# Patient Record
Sex: Female | Born: 1985 | Race: White | Hispanic: No | Marital: Married | Smoking: Current every day smoker
Health system: Southern US, Community
[De-identification: ages and names within clinical notes are randomized; demographics above are authoritative.]

## PROBLEM LIST (undated history)

## (undated) DIAGNOSIS — K922 Gastrointestinal hemorrhage, unspecified: Secondary | ICD-10-CM

## (undated) DIAGNOSIS — F1010 Alcohol abuse, uncomplicated: Secondary | ICD-10-CM

## (undated) DIAGNOSIS — F32A Depression, unspecified: Secondary | ICD-10-CM

## (undated) DIAGNOSIS — K219 Gastro-esophageal reflux disease without esophagitis: Secondary | ICD-10-CM

## (undated) DIAGNOSIS — F419 Anxiety disorder, unspecified: Secondary | ICD-10-CM

## (undated) DIAGNOSIS — D649 Anemia, unspecified: Secondary | ICD-10-CM

## (undated) DIAGNOSIS — F1910 Other psychoactive substance abuse, uncomplicated: Secondary | ICD-10-CM

## (undated) HISTORY — DX: Other psychoactive substance abuse, uncomplicated: F19.10

## (undated) HISTORY — DX: Anemia, unspecified: D64.9

---

## 2010-01-30 ENCOUNTER — Ambulatory Visit: Payer: Self-pay

## 2019-08-14 ENCOUNTER — Emergency Department
Admission: EM | Admit: 2019-08-14 | Discharge: 2019-08-14 | Disposition: A | Discharge status: Home / Self Care | Payer: BLUE CROSS/BLUE SHIELD | Source: Home / Self Care | Attending: Student | Admitting: Student

## 2019-08-14 ENCOUNTER — Emergency Department: Payer: BLUE CROSS/BLUE SHIELD

## 2019-08-14 ENCOUNTER — Other Ambulatory Visit: Payer: Self-pay

## 2019-08-14 DIAGNOSIS — K920 Hematemesis: Secondary | ICD-10-CM | POA: Insufficient documentation

## 2019-08-14 DIAGNOSIS — K921 Melena: Secondary | ICD-10-CM | POA: Diagnosis not present

## 2019-08-14 DIAGNOSIS — K226 Gastro-esophageal laceration-hemorrhage syndrome: Secondary | ICD-10-CM | POA: Diagnosis not present

## 2019-08-14 LAB — COMPREHENSIVE METABOLIC PANEL
ALT: 17 U/L (ref 0–44)
AST: 21 U/L (ref 15–41)
Albumin: 4.7 g/dL (ref 3.5–5.0)
Alkaline Phosphatase: 54 U/L (ref 38–126)
Anion gap: 12 (ref 5–15)
BUN: 26 mg/dL — ABNORMAL HIGH (ref 6–20)
CO2: 21 mmol/L — ABNORMAL LOW (ref 22–32)
Calcium: 9.2 mg/dL (ref 8.9–10.3)
Chloride: 107 mmol/L (ref 98–111)
Creatinine, Ser: 0.61 mg/dL (ref 0.44–1.00)
GFR calc Af Amer: >60 mL/min (ref >60)
GFR calc non Af Amer: >60 mL/min (ref >60)
Glucose, Bld: 102 mg/dL — ABNORMAL HIGH (ref 70–99)
Potassium: 4.7 mmol/L (ref 3.5–5.1)
Sodium: 140 mmol/L (ref 135–145)
Total Bilirubin: 0.7 mg/dL (ref 0.3–1.2)
Total Protein: 8.1 g/dL (ref 6.5–8.1)

## 2019-08-14 LAB — CBC
HCT: 38.7 % (ref 36.0–46.0)
Hemoglobin: 12.5 g/dL (ref 12.0–15.0)
MCH: 30.3 pg (ref 26.0–34.0)
MCHC: 32.3 g/dL (ref 30.0–36.0)
MCV: 93.7 fL (ref 80.0–100.0)
Platelets: 304 10*3/uL (ref 150–400)
RBC: 4.13 MIL/uL (ref 3.87–5.11)
RDW: 13.4 % (ref 11.5–15.5)
WBC: 10.9 10*3/uL — ABNORMAL HIGH (ref 4.0–10.5)
nRBC: 0 % (ref 0.0–0.2)

## 2019-08-14 LAB — URINALYSIS, COMPLETE (UACMP) WITH MICROSCOPIC
Bacteria, UA: NONE SEEN
Bilirubin Urine: NEGATIVE
Glucose, UA: NEGATIVE mg/dL
Hgb urine dipstick: NEGATIVE
Ketones, ur: 80 mg/dL — AB
Leukocytes,Ua: NEGATIVE
Nitrite: NEGATIVE
Protein, ur: NEGATIVE mg/dL
Specific Gravity, Urine: 1.023 (ref 1.005–1.030)
pH: 7 (ref 5.0–8.0)

## 2019-08-14 LAB — PROTIME-INR
INR: 1 (ref 0.8–1.2)
Prothrombin Time: 12.8 seconds (ref 11.4–15.2)

## 2019-08-14 LAB — LIPASE, BLOOD: Lipase: 27 U/L (ref 11–51)

## 2019-08-14 LAB — PREGNANCY, URINE: Preg Test, Ur: NEGATIVE

## 2019-08-14 LAB — POCT PREGNANCY, URINE: Preg Test, Ur: NEGATIVE

## 2019-08-14 LAB — HEMOGLOBIN AND HEMATOCRIT, BLOOD
HCT: 35.3 % — ABNORMAL LOW (ref 36.0–46.0)
Hemoglobin: 11.4 g/dL — ABNORMAL LOW (ref 12.0–15.0)

## 2019-08-14 MED ORDER — ONDANSETRON HCL 4 MG/2ML IJ SOLN
4.0000 mg | Freq: Once | INTRAMUSCULAR | Status: AC
  Administered 2019-08-14: 09:00:00 4 mg via INTRAVENOUS
  Filled 2019-08-14: qty 2

## 2019-08-14 MED ORDER — SODIUM CHLORIDE 0.9% FLUSH
3.0000 mL | Freq: Once | INTRAVENOUS | Status: AC
  Administered 2019-08-14: 09:00:00 3 mL via INTRAVENOUS

## 2019-08-14 MED ORDER — PANTOPRAZOLE SODIUM 40 MG IV SOLR
40.0000 mg | Freq: Once | INTRAVENOUS | Status: AC
  Administered 2019-08-14: 40 mg via INTRAVENOUS
  Filled 2019-08-14: qty 40

## 2019-08-14 MED ORDER — SODIUM CHLORIDE 0.9 % IV BOLUS
1000.0000 mL | Freq: Once | INTRAVENOUS | Status: AC
  Administered 2019-08-14: 1000 mL via INTRAVENOUS

## 2019-08-14 MED ORDER — OMEPRAZOLE 40 MG PO CPDR: 40.0000 mg | DELAYED_RELEASE_CAPSULE | Freq: Every day | ORAL | 0 refills | Status: DC

## 2019-08-14 MED ORDER — ONDANSETRON HCL 4 MG PO TABS: 4.0000 mg | ORAL_TABLET | Freq: Three times a day (TID) | ORAL | 0 refills | Status: DC | PRN

## 2019-08-14 NOTE — ED Provider Notes (Signed)
[facility] Emergency Department Provider Note  ____________________________________________   First MD Initiated Contact with Patient 08/14/19 [phone]     (approximate)  I have reviewed the triage vital signs and the nursing notes.  History  Chief Complaint Hematemesis    HPI Gail Mullins is a 33 y.o. female who presents emergency department for nausea, vomiting, hematemesis.  Patient states she had an initial episode of emesis around 5 AM.  This was nonbloody and looked like the wine she drank the night before.  After this initially vomiting however, she developed several episodes of emesis with bright red blood.  No clots.  She denies any history of prior hematemesis.  She is not any blood thinning medications and denies any heavy NSAID use.  She states last night she drank about a liter of wine.  About 4 to 5 years ago she did have a period of heavy drinking, drinking almost daily for about 8 months.  However, since then she has decreased her drinking, and typically only drinks about twice per week.  She denies any known diagnosis of cirrhosis or varices. She denies any pain.   Past Medical Hx No past medical history on file.  Problem List There are no active problems to display for this patient.   Past Surgical Hx Non-contributory  Medications Prior to Admission medications   Not on File    Allergies Patient has no known allergies.  Family Hx No family history on file.  Social Hx Social History   Tobacco Use  . Smoking status: Not on file  Substance Use Topics  . Alcohol use: Not on file  . Drug use: Not on file     Review of Systems  Constitutional: Negative for fever, chills. Eyes: Negative for visual changes. ENT: Negative for sore throat. Cardiovascular: Negative for chest pain. Respiratory: Negative for shortness of breath. Gastrointestinal: + vomiting Genitourinary: Negative for dysuria. Musculoskeletal: Negative for  leg swelling. Skin: Negative for rash. Neurological: Negative for for headaches.   Physical Exam  Vital Signs: ED Triage Vitals  Enc Vitals Group     BP 08/14/19 0802 (!) 108/51     Pulse Rate 08/14/19 0802 (!) 105     Resp 08/14/19 0802 18     Temp 08/14/19 0802 98.7 F (37.1 C)     Temp Source 08/14/19 0802 Oral     SpO2 08/14/19 0802 100 %     Weight 08/14/19 0812 150 lb (68 kg)     Height 08/14/19 0812 5\' 7"  (1.702 m)     Head Circumference --      Peak Flow --      Pain Score 08/14/19 0812 0     Pain Loc --      Pain Edu? --      Excl. in GC? --     Constitutional: Alert and oriented.  Head: Normocephalic. Atraumatic. Eyes: Conjunctivae clear. Sclera anicteric. Nose: No congestion. No rhinorrhea. Mouth/Throat: Mucous membranes are moist.  Neck: No stridor.   Cardiovascular: Normal rate, regular rhythm. Extremities well perfused. Respiratory: Normal respiratory effort.  Lungs CTAB. Gastrointestinal: Soft. Non-tender. Non-distended. Emesis in bag is guaiac positive.  Musculoskeletal: No lower extremity edema. No deformities. Neurologic:  Normal speech and language. No gross focal neurologic deficits are appreciated.  Skin: Skin is warm, dry and intact. No rash noted. Psychiatric: Mood and affect are appropriate for situation.  EKG  N/A    Radiology  XR: IMPRESSION:  Negative for acute cardiopulmonary disease  Procedures  Procedure(s) performed (including critical care):  Procedures   Initial Impression / Assessment and Plan / ED Course  33 y.o. female who presents to the ED for bloody vomiting.   Suspect likely Mallory Weiss based on history and progression of emesis from initially non-bloody to bloody. She does have a past history of heavy drinking for ~8 months, several years ago. However, this timeline does not seem consistent with development of cirrhosis/varices.   Will check labs, give fluids, PPI, and reassess.   Labs w/o actionable  derangements. No significant drop in hemoglobin after period of observation (12.5 to 11.4 after 1 L IVF would be appropriate). She has had no further episodes of emesis. Will d/c with Rx for PPI and Zofran and advised close GI follow up. Advised cessation of alcohol consumption until GI evaluation. Discussed strict return precautions. Patient is agreeable with plan.    Final Clinical Impression(s) / ED Diagnosis  Final diagnoses:  Hematemesis with nausea       Note:  This document was prepared using Dragon voice recognition software and may include unintentional dictation errors.   Lilia Pro., MD 08/14/19 Curly Rim

## 2019-08-14 NOTE — ED Triage Notes (Signed)
Pt arrives via pov from home, pt reports she woke up this am at 0500 and vomited, noticed vomit was dark, but states didn't think much due to drinking wine last pm, states that she shortly after vomited again and noticed a large amount of blood in vomit, pt vomiting in triage currently and frank blood noted, no food fragments, pt also reports diarrhea that was dark, pt reports 5 episodes of vomiting blood at this time

## 2019-08-14 NOTE — ED Notes (Signed)

## 2019-08-14 NOTE — Discharge Instructions (Addendum)
Thank you for letting us take care of you in the emergency department today.   Please continue to take any regular, prescribed medications.  Please avoid drinking any alcohol until you are seen by the GI doctor, as this can exacerbate the irritation of your esophagus and stomach.  New medications we have prescribed:  - Omeprazole - acid protecting medication for your esophagus and stomach - Zofran - antinausea medication  Please follow up with: - GI doctor  Please return to the ER for any new or worsening symptoms.

## 2019-08-15 ENCOUNTER — Inpatient Hospital Stay
Admission: EM | Admit: 2019-08-15 | Discharge: 2019-08-17 | DRG: 357 | Disposition: A | Discharge status: Home / Self Care | Payer: BLUE CROSS/BLUE SHIELD | Attending: Internal Medicine | Admitting: Internal Medicine

## 2019-08-15 ENCOUNTER — Inpatient Hospital Stay: Payer: BLUE CROSS/BLUE SHIELD | Admitting: Anesthesiology

## 2019-08-15 ENCOUNTER — Other Ambulatory Visit: Payer: Self-pay

## 2019-08-15 ENCOUNTER — Encounter: Payer: Self-pay | Admitting: *Deleted

## 2019-08-15 ENCOUNTER — Emergency Department: Payer: BLUE CROSS/BLUE SHIELD

## 2019-08-15 ENCOUNTER — Encounter
Admission: EM | Disposition: A | Discharge status: Home / Self Care | Payer: Self-pay | Source: Home / Self Care | Attending: Pulmonary Disease

## 2019-08-15 DIAGNOSIS — K920 Hematemesis: Secondary | ICD-10-CM

## 2019-08-15 DIAGNOSIS — D62 Acute posthemorrhagic anemia: Secondary | ICD-10-CM

## 2019-08-15 DIAGNOSIS — F1020 Alcohol dependence, uncomplicated: Secondary | ICD-10-CM | POA: Diagnosis present

## 2019-08-15 DIAGNOSIS — F17200 Nicotine dependence, unspecified, uncomplicated: Secondary | ICD-10-CM | POA: Diagnosis present

## 2019-08-15 DIAGNOSIS — Z20828 Contact with and (suspected) exposure to other viral communicable diseases: Secondary | ICD-10-CM | POA: Diagnosis present

## 2019-08-15 DIAGNOSIS — Z79899 Other long term (current) drug therapy: Secondary | ICD-10-CM

## 2019-08-15 DIAGNOSIS — F1011 Alcohol abuse, in remission: Secondary | ICD-10-CM | POA: Diagnosis present

## 2019-08-15 DIAGNOSIS — F17210 Nicotine dependence, cigarettes, uncomplicated: Secondary | ICD-10-CM | POA: Diagnosis present

## 2019-08-15 DIAGNOSIS — K922 Gastrointestinal hemorrhage, unspecified: Secondary | ICD-10-CM | POA: Diagnosis not present

## 2019-08-15 DIAGNOSIS — K2970 Gastritis, unspecified, without bleeding: Secondary | ICD-10-CM | POA: Diagnosis present

## 2019-08-15 DIAGNOSIS — K219 Gastro-esophageal reflux disease without esophagitis: Secondary | ICD-10-CM | POA: Diagnosis not present

## 2019-08-15 DIAGNOSIS — R000 Tachycardia, unspecified: Secondary | ICD-10-CM | POA: Diagnosis present

## 2019-08-15 DIAGNOSIS — K921 Melena: Secondary | ICD-10-CM | POA: Diagnosis present

## 2019-08-15 DIAGNOSIS — F1010 Alcohol abuse, uncomplicated: Secondary | ICD-10-CM | POA: Diagnosis not present

## 2019-08-15 DIAGNOSIS — K226 Gastro-esophageal laceration-hemorrhage syndrome: Secondary | ICD-10-CM | POA: Diagnosis present

## 2019-08-15 DIAGNOSIS — I959 Hypotension, unspecified: Secondary | ICD-10-CM | POA: Diagnosis present

## 2019-08-15 DIAGNOSIS — F419 Anxiety disorder, unspecified: Secondary | ICD-10-CM | POA: Diagnosis present

## 2019-08-15 DIAGNOSIS — Z720 Tobacco use: Secondary | ICD-10-CM | POA: Diagnosis not present

## 2019-08-15 HISTORY — PX: ESOPHAGOGASTRODUODENOSCOPY: SHX5428

## 2019-08-15 HISTORY — DX: Alcohol abuse, uncomplicated: F10.10

## 2019-08-15 LAB — COMPREHENSIVE METABOLIC PANEL
ALT: 10 U/L (ref 0–44)
AST: 18 U/L (ref 15–41)
Albumin: 3.1 g/dL — ABNORMAL LOW (ref 3.5–5.0)
Alkaline Phosphatase: 31 U/L — ABNORMAL LOW (ref 38–126)
Anion gap: 7 (ref 5–15)
BUN: 50 mg/dL — ABNORMAL HIGH (ref 6–20)
CO2: 19 mmol/L — ABNORMAL LOW (ref 22–32)
Calcium: 8.1 mg/dL — ABNORMAL LOW (ref 8.9–10.3)
Chloride: 110 mmol/L (ref 98–111)
Creatinine, Ser: 0.66 mg/dL (ref 0.44–1.00)
GFR calc Af Amer: >60 mL/min (ref >60)
GFR calc non Af Amer: >60 mL/min (ref >60)
Glucose, Bld: 154 mg/dL — ABNORMAL HIGH (ref 70–99)
Potassium: 4.9 mmol/L (ref 3.5–5.1)
Sodium: 136 mmol/L (ref 135–145)
Total Bilirubin: 0.7 mg/dL (ref 0.3–1.2)
Total Protein: 5.3 g/dL — ABNORMAL LOW (ref 6.5–8.1)

## 2019-08-15 LAB — PROTIME-INR
INR: 1.1 (ref 0.8–1.2)
Prothrombin Time: 14.5 seconds (ref 11.4–15.2)

## 2019-08-15 LAB — CBC
HCT: 18.6 % — ABNORMAL LOW (ref 36.0–46.0)
HCT: 21.4 % — ABNORMAL LOW (ref 36.0–46.0)
HCT: 20 % — ABNORMAL LOW (ref 36.0–46.0)
Hemoglobin: 6.2 g/dL — ABNORMAL LOW (ref 12.0–15.0)
Hemoglobin: 7.2 g/dL — ABNORMAL LOW (ref 12.0–15.0)
Hemoglobin: 6.5 g/dL — ABNORMAL LOW (ref 12.0–15.0)
MCH: 31.2 pg (ref 26.0–34.0)
MCH: 30.8 pg (ref 26.0–34.0)
MCH: 31 pg (ref 26.0–34.0)
MCHC: 33.3 g/dL (ref 30.0–36.0)
MCHC: 33.6 g/dL (ref 30.0–36.0)
MCHC: 32.5 g/dL (ref 30.0–36.0)
MCV: 93.5 fL (ref 80.0–100.0)
MCV: 91.5 fL (ref 80.0–100.0)
MCV: 95.2 fL (ref 80.0–100.0)
Platelets: 246 10*3/uL (ref 150–400)
Platelets: 183 10*3/uL (ref 150–400)
Platelets: 177 10*3/uL (ref 150–400)
RBC: 1.99 MIL/uL — ABNORMAL LOW (ref 3.87–5.11)
RBC: 2.34 MIL/uL — ABNORMAL LOW (ref 3.87–5.11)
RBC: 2.1 MIL/uL — ABNORMAL LOW (ref 3.87–5.11)
RDW: 13.2 % (ref 11.5–15.5)
RDW: 13.2 % (ref 11.5–15.5)
RDW: 13.4 % (ref 11.5–15.5)
WBC: 22.2 10*3/uL — ABNORMAL HIGH (ref 4.0–10.5)
WBC: 12.6 10*3/uL — ABNORMAL HIGH (ref 4.0–10.5)
WBC: 12.9 10*3/uL — ABNORMAL HIGH (ref 4.0–10.5)
nRBC: 0 % (ref 0.0–0.2)
nRBC: 0 % (ref 0.0–0.2)
nRBC: 0 % (ref 0.0–0.2)

## 2019-08-15 LAB — DIFFERENTIAL
Abs Immature Granulocytes: 0.1 10*3/uL — ABNORMAL HIGH (ref 0.00–0.07)
Basophils Absolute: 0 10*3/uL (ref 0.0–0.1)
Basophils Relative: 0 %
Eosinophils Absolute: 0 10*3/uL (ref 0.0–0.5)
Eosinophils Relative: 0 %
Immature Granulocytes: 1 %
Lymphocytes Relative: 10 %
Lymphs Abs: 1.3 10*3/uL (ref 0.7–4.0)
Monocytes Absolute: 0.1 10*3/uL (ref 0.1–1.0)
Monocytes Relative: 1 %
Neutro Abs: 11.1 10*3/uL — ABNORMAL HIGH (ref 1.7–7.7)
Neutrophils Relative %: 88 %

## 2019-08-15 LAB — HIV ANTIBODY (ROUTINE TESTING W REFLEX): HIV Screen 4th Generation wRfx: NONREACTIVE

## 2019-08-15 LAB — ABO/RH: ABO/RH(D): A POS

## 2019-08-15 LAB — LACTIC ACID, PLASMA: Lactic Acid, Venous: 0.9 mmol/L (ref 0.5–1.9)

## 2019-08-15 LAB — SARS CORONAVIRUS 2 BY RT PCR (HOSPITAL ORDER, PERFORMED IN ~~LOC~~ HOSPITAL LAB): SARS Coronavirus 2: NEGATIVE

## 2019-08-15 LAB — AMMONIA: Ammonia: <9 umol/L — ABNORMAL LOW (ref 9–35)

## 2019-08-15 LAB — LIPASE, BLOOD: Lipase: 25 U/L (ref 11–51)

## 2019-08-15 LAB — APTT: aPTT: 27 seconds (ref 24–36)

## 2019-08-15 SURGERY — EGD (ESOPHAGOGASTRODUODENOSCOPY)
Anesthesia: General

## 2019-08-15 MED ORDER — SODIUM CHLORIDE 0.9 % IV SOLN
50.0000 ug/h | INTRAVENOUS | Status: DC
  Filled 2019-08-15 (×2): qty 1

## 2019-08-15 MED ORDER — THIAMINE HCL 100 MG/ML IJ SOLN
100.0000 mg | Freq: Every day | INTRAMUSCULAR | Status: DC
  Administered 2019-08-16: 100 mg via INTRAVENOUS
  Filled 2019-08-15: qty 2

## 2019-08-15 MED ORDER — SEVOFLURANE IN SOLN
RESPIRATORY_TRACT | Status: AC
  Filled 2019-08-15: qty 250

## 2019-08-15 MED ORDER — MIDAZOLAM HCL 2 MG/2ML IJ SOLN
INTRAMUSCULAR | Status: DC | PRN
  Administered 2019-08-15: 2 mg via INTRAVENOUS

## 2019-08-15 MED ORDER — SUGAMMADEX SODIUM 200 MG/2ML IV SOLN
INTRAVENOUS | Status: DC | PRN
  Administered 2019-08-15: 200 mg via INTRAVENOUS

## 2019-08-15 MED ORDER — PROPOFOL 10 MG/ML IV BOLUS
INTRAVENOUS | Status: AC
  Filled 2019-08-15: qty 20

## 2019-08-15 MED ORDER — FENTANYL CITRATE (PF) 100 MCG/2ML IJ SOLN
INTRAMUSCULAR | Status: DC | PRN
  Administered 2019-08-15: 50 ug via INTRAVENOUS

## 2019-08-15 MED ORDER — ONDANSETRON HCL 4 MG/2ML IJ SOLN
4.0000 mg | Freq: Four times a day (QID) | INTRAMUSCULAR | Status: DC | PRN
  Administered 2019-08-15: 09:00:00 4 mg via INTRAVENOUS

## 2019-08-15 MED ORDER — EPINEPHRINE 1 MG/10ML IJ SOSY
PREFILLED_SYRINGE | INTRAMUSCULAR | Status: DC | PRN
  Administered 2019-08-15: 0.1 mg

## 2019-08-15 MED ORDER — EPINEPHRINE 1 MG/10ML IJ SOSY
PREFILLED_SYRINGE | INTRAMUSCULAR | Status: AC
  Filled 2019-08-15: qty 10

## 2019-08-15 MED ORDER — SUCCINYLCHOLINE CHLORIDE 20 MG/ML IJ SOLN
INTRAMUSCULAR | Status: DC | PRN
  Administered 2019-08-15: 100 mg via INTRAVENOUS

## 2019-08-15 MED ORDER — SODIUM CHLORIDE 0.9 % IV BOLUS
1000.0000 mL | Freq: Once | INTRAVENOUS | Status: AC
  Administered 2019-08-15: 1000 mL via INTRAVENOUS

## 2019-08-15 MED ORDER — LACTATED RINGERS IV SOLN
INTRAVENOUS | Status: DC | PRN
  Administered 2019-08-15: 09:00:00 via INTRAVENOUS

## 2019-08-15 MED ORDER — SODIUM CHLORIDE 0.9 % IV SOLN
50.0000 ug/h | INTRAVENOUS | Status: DC
  Administered 2019-08-15 – 2019-08-17 (×6): 50 ug/h via INTRAVENOUS
  Filled 2019-08-15 (×14): qty 5

## 2019-08-15 MED ORDER — SODIUM CHLORIDE 0.9 % IV SOLN
INTRAVENOUS | Status: DC
  Administered 2019-08-15 (×2): via INTRAVENOUS

## 2019-08-15 MED ORDER — SODIUM CHLORIDE 0.9% IV SOLUTION: Freq: Once | INTRAVENOUS | Status: DC

## 2019-08-15 MED ORDER — ONDANSETRON HCL 4 MG/2ML IJ SOLN
4.0000 mg | Freq: Once | INTRAMUSCULAR | Status: AC
  Administered 2019-08-15: 4 mg via INTRAVENOUS
  Filled 2019-08-15: qty 2

## 2019-08-15 MED ORDER — ROCURONIUM BROMIDE 100 MG/10ML IV SOLN
INTRAVENOUS | Status: DC | PRN
  Administered 2019-08-15: 10 mg via INTRAVENOUS

## 2019-08-15 MED ORDER — SODIUM CHLORIDE 0.9 % IV SOLN
80.0000 mg | Freq: Once | INTRAVENOUS | Status: AC
  Administered 2019-08-15: 80 mg via INTRAVENOUS
  Filled 2019-08-15: qty 80

## 2019-08-15 MED ORDER — PROPOFOL 10 MG/ML IV BOLUS: INTRAVENOUS | Status: DC | PRN

## 2019-08-15 MED ORDER — MIDAZOLAM HCL 2 MG/2ML IJ SOLN
INTRAMUSCULAR | Status: AC
  Filled 2019-08-15: qty 2

## 2019-08-15 MED ORDER — SODIUM CHLORIDE 0.9 % IV SOLN
10.0000 mL/h | Freq: Once | INTRAVENOUS | Status: AC
  Administered 2019-08-15: 10 mL/h via INTRAVENOUS

## 2019-08-15 MED ORDER — SODIUM CHLORIDE 0.9 % IV SOLN
1.0000 g | INTRAVENOUS | Status: AC
  Filled 2019-08-15: qty 10

## 2019-08-15 MED ORDER — OCTREOTIDE LOAD VIA INFUSION
50.0000 ug | Freq: Once | INTRAVENOUS | Status: AC
  Administered 2019-08-15: 50 ug via INTRAVENOUS
  Filled 2019-08-15: qty 25

## 2019-08-15 MED ORDER — THIAMINE HCL 100 MG/ML IJ SOLN
Freq: Once | INTRAVENOUS | Status: AC
  Administered 2019-08-15: 14:00:00 via INTRAVENOUS
  Filled 2019-08-15: qty 1000

## 2019-08-15 MED ORDER — PROPOFOL 10 MG/ML IV BOLUS
INTRAVENOUS | Status: DC | PRN
  Administered 2019-08-15: 160 mg via INTRAVENOUS

## 2019-08-15 MED ORDER — CHLORHEXIDINE GLUCONATE CLOTH 2 % EX PADS
6.0000 | MEDICATED_PAD | Freq: Every day | CUTANEOUS | Status: DC
  Administered 2019-08-15 – 2019-08-17 (×3): 6 via TOPICAL

## 2019-08-15 MED ORDER — THIAMINE HCL 100 MG/ML IJ SOLN
100.0000 mg | Freq: Once | INTRAMUSCULAR | Status: AC
  Administered 2019-08-15: 100 mg via INTRAVENOUS
  Filled 2019-08-15: qty 2

## 2019-08-15 MED ORDER — FENTANYL CITRATE (PF) 100 MCG/2ML IJ SOLN
INTRAMUSCULAR | Status: AC
  Filled 2019-08-15: qty 2

## 2019-08-15 MED ORDER — SODIUM CHLORIDE 0.9 % IV SOLN
8.0000 mg/h | INTRAVENOUS | Status: DC
  Administered 2019-08-15 – 2019-08-17 (×6): 8 mg/h via INTRAVENOUS
  Filled 2019-08-15 (×6): qty 80

## 2019-08-15 MED ORDER — TRANEXAMIC ACID-NACL 1000-0.7 MG/100ML-% IV SOLN
1000.0000 mg | INTRAVENOUS | Status: AC
  Administered 2019-08-15: 1000 mg via INTRAVENOUS
  Filled 2019-08-15: qty 100

## 2019-08-15 MED ORDER — DEXAMETHASONE SODIUM PHOSPHATE 10 MG/ML IJ SOLN
INTRAMUSCULAR | Status: DC | PRN
  Administered 2019-08-15: 5 mg via INTRAVENOUS

## 2019-08-15 NOTE — ED Notes (Signed)
Pt has blood infusing currently, blood specimens will need to be drawn post transfusion.

## 2019-08-15 NOTE — Anesthesia Postprocedure Evaluation (Signed)
Anesthesia Post Note  Patient: Gail Mullins  Procedure(s) Performed: ESOPHAGOGASTRODUODENOSCOPY (EGD) (N/A )  Patient location during evaluation: Endoscopy Anesthesia Type: General Level of consciousness: awake and alert and oriented Pain management: pain level controlled Vital Signs Assessment: post-procedure vital signs reviewed and stable Respiratory status: spontaneous breathing Cardiovascular status: blood pressure returned to baseline Anesthetic complications: no     Last Vitals:  Vitals:   08/15/19 0820 08/15/19 0958  BP: (!) 115/56   Pulse: 85   Resp: 16 18  Temp: (!) 36.1 C   SpO2: 100%     Last Pain:  Vitals:   08/15/19 0820  TempSrc: Tympanic  PainSc: 0-No pain                 Dasani Thurlow

## 2019-08-15 NOTE — ED Provider Notes (Signed)
[name] Emergency Department Provider Note  ____________________________________________   First MD Initiated Contact with Patient 08/15/19 [phone]     (approximate)  I have reviewed the triage vital signs and the nursing notes.   HISTORY  Chief Complaint Hematemesis  Level 5 caveat:  history/ROS limited by acute/critical illness  HPI Gail Mullins is a 33 y.o. female who presents by EMS for evaluation of passing out as well as persistent bloody vomiting.  She was seen less than 24 hours ago in the emergency department for similar but milder symptoms.  She felt better at the time of discharge and had not vomited again until she was arriving here tonight, but the reason that she called EMS is because she reports that she has passed out 4 times at home.  She feels okay if she is lying down but then when she gets up she immediately passes out.  She feels very weak all over.  She says that she is pale and has been a little bit sweaty.   Felt nauseated but did not start vomiting again and so she got to the ED and then she filled up 1-1/2 emesis bags of bloody vomit.  She also says that her stool has been very dark and sticky.  She denies any pain, just feels very weak and lightheaded.  Specifically she has no abdominal pain and no shortness of breath.  She admits prior alcohol use but it also admits that she drank very heavily on Friday night and her symptoms started early Saturday morning and have been persistent.  She denies fever/chills, chest pain, shortness of breath, abdominal pain, and dysuria.  Symptoms are severe and were relatively acute in onset about 24 hours ago but acutely got worse tonight as well.       Past Medical History:  Diagnosis Date  . Alcohol abuse     Patient Active Problem List   Diagnosis Date Noted  . Acute gastrointestinal hemorrhage 08/15/2019    History reviewed. No pertinent surgical history.  Prior to Admission  medications   Medication Sig Start Date End Date Taking? Authorizing Provider  omeprazole (PRILOSEC) 40 MG capsule Take 1 capsule (40 mg total) by mouth daily. 08/14/19 10/13/19 Yes Miguel Aschoff., MD  ondansetron (ZOFRAN) 4 MG tablet Take 1 tablet (4 mg total) by mouth every 8 (eight) hours as needed for up to 7 days for nausea or vomiting. 08/14/19 08/21/19 Yes Miguel Aschoff., MD    Allergies Patient has no known allergies.  History reviewed. No pertinent family history.  Social History Social History   Tobacco Use  . Smoking status: Current Every Day Smoker  . Smokeless tobacco: Never Used  Substance Use Topics  . Alcohol use: Yes  . Drug use: Not Currently    Review of Systems Level 5 caveat:  history/ROS limited by acute/critical illness   constitutional: Weak, lightheaded, dizzy. Eyes: No visual changes. ENT: No sore throat. Cardiovascular: Denies chest pain. Respiratory: Denies shortness of breath. Gastrointestinal: No abdominal pain but nausea and hematemesis. Genitourinary: Negative for dysuria. Musculoskeletal: Negative for neck pain.  Negative for back pain. Integumentary: Negative for rash. Neurological: Generalized weakness but negative for headaches, focal weakness or numbness.   ____________________________________________   PHYSICAL EXAM:  VITAL SIGNS: ED Triage Vitals  Enc Vitals Group     BP 08/15/19 0404 (!) 96/49     Pulse Rate 08/15/19 0404 (!) 111     Resp 08/15/19 0404 15  Temp 08/15/19 0404 98.3 F (36.8 C)     Temp Source 08/15/19 0404 Oral     SpO2 08/15/19 0426 100 %     Weight --      Height --      Head Circumference --      Peak Flow --      Pain Score 08/15/19 0424 0     Pain Loc --      Pain Edu? --      Excl. in GC? --     Constitutional: Alert and oriented but very ill-appearing, pale, diaphoretic. Eyes: Conjunctivae are normal.  Head: Atraumatic. Nose: No congestion/rhinnorhea. Mouth/Throat: Patient is wearing a  mask. Neck: No stridor.  No meningeal signs.   Cardiovascular: Tachycardia, regular rhythm. Good peripheral circulation. Grossly normal heart sounds. Respiratory: Normal respiratory effort.  No retractions. Gastrointestinal: Soft and nontender. No distention.  Rectal exam is notable for melena.  ED chaperone was present throughout the exam. Musculoskeletal: No lower extremity tenderness nor edema. No gross deformities of extremities. Neurologic:  Normal speech and language. No gross focal neurologic deficits are appreciated.  Skin:  Skin is warm, dry and intact. Psychiatric: Mood and affect are normal. Speech and behavior are normal.  ____________________________________________   LABS (all labs ordered are listed, but only abnormal results are displayed)  Labs Reviewed  COMPREHENSIVE METABOLIC PANEL - Abnormal; Notable for the following components:      Result Value   CO2 19 (*)    Glucose, Bld 154 (*)    BUN 50 (*)    Calcium 8.1 (*)    Total Protein 5.3 (*)    Albumin 3.1 (*)    Alkaline Phosphatase 31 (*)    All other components within normal limits  CBC - Abnormal; Notable for the following components:   WBC 22.2 (*)    RBC 1.99 (*)    Hemoglobin 6.2 (*)    HCT 18.6 (*)    All other components within normal limits  AMMONIA - Abnormal; Notable for the following components:   Ammonia <9 (*)    All other components within normal limits  SARS CORONAVIRUS 2 BY RT PCR (HOSPITAL ORDER, PERFORMED IN [facility])  CULTURE, BLOOD (ROUTINE X 2)  CULTURE, BLOOD (ROUTINE X 2)  URINE CULTURE  PROTIME-INR  LIPASE, BLOOD  APTT  LACTIC ACID, PLASMA  DIFFERENTIAL  HIV ANTIBODY (ROUTINE TESTING W REFLEX)  URINALYSIS, ROUTINE W REFLEX MICROSCOPIC  CBC  CBC  CBC  POC URINE PREG, ED  TYPE AND SCREEN  PREPARE RBC (CROSSMATCH)  ABO/RH   ____________________________________________  EKG  ED ECG REPORT I, Loleta Rose, the attending physician, personally viewed and  interpreted this ECG.  Date: 08/15/2019 EKG Time: 4:07 AM Rate: 101 Rhythm: Borderline sinus tachycardia QRS Axis: normal Intervals: normal ST/T Wave abnormalities: normal Narrative Interpretation: no evidence of acute ischemia  ____________________________________________  RADIOLOGY I, Loleta Rose, personally viewed and evaluated these images (plain radiographs) as part of my medical decision making, as well as reviewing the written report by the radiologist.  ED MD interpretation: No indication for emergent imaging tonight.  Chest x-ray results are from her visit yesterday.  Official radiology report(s): Dg Chest Portable 1 View  Result Date: 08/15/2019 CLINICAL DATA:  Syncopal episode with vomiting blood. EXAM: PORTABLE CHEST 1 VIEW COMPARISON:  08/14/2019 FINDINGS: Lungs are adequately inflated and otherwise clear. Cardiomediastinal silhouette and remainder of the exam is unchanged. IMPRESSION: No active disease. Electronically Signed   By: Reuel Boom  Micheline Maze M.D.   On: 08/15/2019 06:07   Dg Chest Port 1 View  Result Date: 08/14/2019 CLINICAL DATA:  33 year old female with vomiting EXAM: PORTABLE CHEST 1 VIEW COMPARISON:  None. FINDINGS: The heart size and mediastinal contours are within normal limits. Both lungs are clear. The visualized skeletal structures are unremarkable. IMPRESSION: Negative for acute cardiopulmonary disease Electronically Signed   By: Gilmer Mor D.O.   On: 08/14/2019 09:19    ____________________________________________   PROCEDURES   Procedure(s) performed (including Critical Care):  .Critical Care Performed by: Loleta Rose, MD Authorized by: Loleta Rose, MD   Critical care provider statement:    Critical care time (minutes):  45   Critical care time was exclusive of:  Separately billable procedures and treating other patients   Critical care was necessary to treat or prevent imminent or life-threatening deterioration of the following  conditions:  Circulatory failure (GI bleeding)   Critical care was time spent personally by me on the following activities:  Development of treatment plan with patient or surrogate, discussions with consultants, evaluation of patient's response to treatment, examination of patient, obtaining history from patient or surrogate, ordering and performing treatments and interventions, ordering and review of laboratory studies, ordering and review of radiographic studies, pulse oximetry, re-evaluation of patient's condition and review of old charts     ____________________________________________   INITIAL IMPRESSION / MDM / ASSESSMENT AND PLAN / ED COURSE  As part of my medical decision making, I reviewed the following data within the electronic MEDICAL RECORD NUMBER Nursing notes reviewed and incorporated, Labs reviewed , EKG interpreted , Old chart reviewed, Discussed with admitting physician , A consult was requested and obtained from this/these consultant(s) Gastroenterology and Notes from prior ED visits   Differential diagnosis includes, but is not limited to, esophageal varices, Mallory-Weiss tear, ulcer.  Patient is very ill-appearing I suspect a substantial drop in hemoglobin from her prior number.  I personally witnessed the hematemesis and I verify that she has melena as well.  She is hypotensive and tachycardic secondary to acute blood loss.  I am ordering the standard ED GI bleeding protocol.  Medications I am ordering include: TXA 1 g IV, octreotide bolus and infusion, pantoprazole bolus and infusion, 1 L normal saline followed by a rate of 125 mL/h.  I anticipate the patient will need blood as well and the type and screen is pending.  I have also ordered ceftriaxone 1 g IV given the upper GI source of her bleeding.      Clinical Course as of Aug 15 843  Wynelle Link Aug 15, 2019  9147 Given the very high probability that the patient will need ICU level care and likely urgent/emergent endoscopy, I  discussed the case by phone with Lajuan Lines the nursing supervisor.  She has the authority to order a rapid Covid swab in the event of ICU admission or emergent/urgent procedures and she agreed with my assessment and will be putting in the order.   [CF]  0448 The patient's hemoglobin has dropped 5 points in about 18 hours.  I have ordered emergent administration of 1 unit of RBCs and for them to crossmatch 2 units ahead.  Discussed with patient including my usual and customary blood products administration risks and benefits discussion.  Hemoglobin(!): 6.2 [CF]  0504 Patient had another episode of hematemesis and another bowel movement.  Maintaining blood pressure but just barely.  I spoke with the lab and they said that her type and screen results should  be back in about 5 minutes.  Given this, I am not ordering uncrossed matched blood but have ordered emergency release 1 PRBCs unit now with 2 units to follow.   [CF]  0507 Leukocytosis now 22.2, up from about 10 on her visit yesterday.  Comprehensive metabolic panel is generally stable other than an elevation of her BUN.  Platelets are normal, hemoglobin dropped precipitously as described above.  WBC(!): 22.2 [CF]  0508 Patient now has 3 peripheral IVs   [CF]  0537 I discussed the case with Dr. Alice Reichert with gastroenterology.  He agreed with my management thus far and is coming in to see the patient in the ED.  After speaking with him I put in a consult to the hospitalist for admission.   [CF]  3710 Discussed by phone with Dr. Sidney Ace who will admit to the ICU (stepdown at a minimum).   [CF]  [phone] Discussed with Dr. Sidney Ace again.  He said the ICU team will be handling the admission and that I am supposed to put in an ICU consult order, which I did.   [CF]  0623 Ammonia(!): <9 [CF]  4854 Informed Dr. Alice Reichert of the negative COVID-19 test by secure text  SARS Coronavirus 2: NEGATIVE [CF]  6270 No acute abnormalities identified on chest x-ray including no  free air that might suggest a Boerhaave's  DG Chest Portable 1 View [CF]    Clinical Course User Index [CF] Hinda Kehr, MD     ____________________________________________  FINAL CLINICAL IMPRESSION(S) / ED DIAGNOSES  Final diagnoses:  Hematemesis with nausea  Melena  Acute blood loss anemia     MEDICATIONS GIVEN DURING THIS VISIT:  Medications  sodium chloride 0.9 % bolus 1,000 mL (0 mLs Intravenous Stopped 08/15/19 0556)    And  0.9 %  sodium chloride infusion ( Intravenous Transfusing/Transfer 08/15/19 0749)  pantoprazole (PROTONIX) 80 mg in sodium chloride 0.9 % 250 mL (0.32 mg/mL) infusion (8 mg/hr Intravenous Transfusing/Transfer 08/15/19 0749)  octreotide (SANDOSTATIN) 500 mcg in sodium chloride 0.9 % 250 mL (2 mcg/mL) infusion (50 mcg/hr Intravenous Transfusing/Transfer 08/15/19 0749)  0.9 %  sodium chloride infusion ( Intravenous MAR Hold 08/15/19 0816)  cefTRIAXone (ROCEPHIN) 1 g in sodium chloride 0.9 % 100 mL IVPB ( Intravenous MAR Hold 08/15/19 0816)  ondansetron (ZOFRAN) injection 4 mg ( Intravenous MAR Hold 08/15/19 0816)  octreotide (SANDOSTATIN) 2 mcg/mL load via infusion 50 mcg (50 mcg Intravenous Bolus from Bag 08/15/19 0538)  pantoprazole (PROTONIX) 80 mg in sodium chloride 0.9 % 100 mL IVPB (0 mg Intravenous Stopped 08/15/19 0620)  ondansetron (ZOFRAN) injection 4 mg (4 mg Intravenous Given 08/15/19 0534)  thiamine (B-1) injection 100 mg (100 mg Intravenous Given 08/15/19 0542)  tranexamic acid (CYKLOKAPRON) IVPB 1,000 mg (0 mg Intravenous Stopped 08/15/19 0533)  fentaNYL (SUBLIMAZE) 100 MCG/2ML injection (has no administration in time range)  midazolam (VERSED) 2 MG/2ML injection (has no administration in time range)  propofol (DIPRIVAN) 10 mg/mL bolus/IV push (has no administration in time range)  sevoflurane inhalation liquid (has no administration in time range)     ED Discharge Orders    None      *Please note:  Tammee Thielke was evaluated in  Emergency Department on 08/15/2019 for the symptoms described in the history of present illness. She was evaluated in the context of the global COVID-19 pandemic, which necessitated consideration that the patient might be at risk for infection with the SARS-CoV-2 virus that causes COVID-19. Institutional protocols and algorithms that pertain  to the evaluation of patients at risk for COVID-19 are in a state of rapid change based on information released by regulatory bodies including the CDC and federal and state organizations. These policies and algorithms were followed during the patient's care in the ED.  Some ED evaluations and interventions may be delayed as a result of limited staffing during the pandemic.*  Note:  This document was prepared using Dragon voice recognition software and may include unintentional dictation errors.   Loleta RoseForbach, Mckenzey Parcell, MD 08/15/19 [phone]

## 2019-08-15 NOTE — Anesthesia Procedure Notes (Signed)
Procedure Name: Intubation Date/Time: 08/15/2019 8:37 AM Performed by: Justus Memory, CRNA Pre-anesthesia Checklist: Patient identified, Patient being monitored, Timeout performed, Emergency Drugs available and Suction available Patient Re-evaluated:Patient Re-evaluated prior to induction Oxygen Delivery Method: Circle system utilized Preoxygenation: Pre-oxygenation with 100% oxygen Induction Type: IV induction, Rapid sequence and Cricoid Pressure applied Laryngoscope Size: Mac and 3 Grade View: Grade I Tube type: Oral Tube size: 7.0 mm Number of attempts: 1 Airway Equipment and Method: Stylet,  Video-laryngoscopy and Rigid stylet Placement Confirmation: ETT inserted through vocal cords under direct vision,  positive ETCO2 and breath sounds checked- equal and bilateral Secured at: 21 cm Tube secured with: Tape Dental Injury: Teeth and Oropharynx as per pre-operative assessment  Future Recommendations: Recommend- induction with short-acting agent, and alternative techniques readily available

## 2019-08-15 NOTE — Consult Note (Signed)
[facility] GI Inpatient Consult Note   Kathline Magic, M.D.  Reason for Consult: UGI bleed   Attending Requesting Consult: Ottie Glazier, M.D.   History of Present Illness: Gail Mullins is a 33 y.o. female presents for UGI bleed. 33 y/o female presents with acute UGI bleed and severe anemia of  6.2 from 11.4 yesterday at her initial ED visit. Patient denies CP, SOB, abdominal pain. Patient drinks a fair amount of ETOH, possibly too much.  Had melenic stool in ED and decr BP into 90's. IV resuscitation performed and BP now low 100's.     Past Medical History:  Past Medical History:  Diagnosis Date  . Alcohol abuse     Problem List: Patient Active Problem List   Diagnosis Date Noted  . Acute gastrointestinal hemorrhage 08/15/2019    Past Surgical History: History reviewed. No pertinent surgical history.  Allergies: No Known Allergies  Home Medications: (Not in a hospital admission)  Home medication reconciliation was completed with the patient.   Scheduled Inpatient Medications:    Continuous Inpatient Infusions:   . sodium chloride 125 mL/hr at 08/15/19 0545  . sodium chloride    . cefTRIAXone (ROCEPHIN)  IV    . octreotide  (SANDOSTATIN)    IV infusion 50 mcg/hr (08/15/19 0537)  . pantoprozole (PROTONIX) infusion 8 mg/hr (08/15/19 0535)    PRN Inpatient Medications:  ondansetron (ZOFRAN) IV  Family History: family history is not on file.   GI Family History: Negative  Social History:   reports that she has been smoking. She has never used smokeless tobacco. She reports current alcohol use. She reports previous drug use. The patient denies ETOH, tobacco, or drug use.    Review of Systems: Review of Systems - Negative except HPI(  Physical Examination: BP (!) 106/51   Pulse 95   Temp 98.7 F (37.1 C)   Resp 12   LMP 08/08/2019 (Exact Date)   SpO2 100%  Physical Exam Constitutional:      General: She is not in acute distress.     Appearance: Normal appearance. She is normal weight. She is ill-appearing. She is not toxic-appearing or diaphoretic.  HENT:     Head: Normocephalic.     Nose: Nose normal.     Mouth/Throat:     Pharynx: Oropharynx is clear.  Eyes:     Extraocular Movements: Extraocular movements intact.     Pupils: Pupils are equal, round, and reactive to light.  Neck:     Musculoskeletal: Normal range of motion.  Cardiovascular:     Rate and Rhythm: Normal rate.     Pulses: Normal pulses.     Heart sounds: No friction rub. No gallop.   Pulmonary:     Effort: Pulmonary effort is normal.     Breath sounds: Normal breath sounds.  Abdominal:     General: Abdomen is flat. Bowel sounds are normal.     Palpations: Abdomen is soft. There is no mass.     Tenderness: There is no abdominal tenderness. There is no guarding or rebound.     Hernia: No hernia is present.  Skin:    Coloration: Skin is pale.  Neurological:     General: No focal deficit present.     Mental Status: She is alert and oriented to person, place, and time.  Psychiatric:        Mood and Affect: Mood normal.        Behavior: Behavior normal.  Thought Content: Thought content normal.        Judgment: Judgment normal.     Data: Lab Results  Component Value Date   WBC 22.2 (H) 08/15/2019   HGB 6.2 (L) 08/15/2019   HCT 18.6 (L) 08/15/2019   MCV 93.5 08/15/2019   PLT 246 08/15/2019   Recent Labs  Lab 08/14/19 0813 08/14/19 1056 08/15/19 0418  HGB 12.5 11.4* 6.2*   Lab Results  Component Value Date   NA 136 08/15/2019   K 4.9 08/15/2019   CL 110 08/15/2019   CO2 19 (L) 08/15/2019   BUN 50 (H) 08/15/2019   CREATININE 0.66 08/15/2019   Lab Results  Component Value Date   ALT 10 08/15/2019   AST 18 08/15/2019   ALKPHOS 31 (L) 08/15/2019   BILITOT 0.7 08/15/2019   Recent Labs  Lab 08/15/19 0418  APTT 27  INR 1.1   CBC Latest Ref Rng & Units 08/15/2019 08/14/2019 08/14/2019  WBC 4.0 - 10.5 K/uL 22.2(H) -  10.9(H)  Hemoglobin 12.0 - 15.0 g/dL 6.2(L) 11.4(L) 12.5  Hematocrit 36.0 - 46.0 % 18.6(L) 35.3(L) 38.7  Platelets 150 - 400 K/uL 246 - 304    STUDIES: Dg Chest Portable 1 View  Result Date: 08/15/2019 CLINICAL DATA:  Syncopal episode with vomiting blood. EXAM: PORTABLE CHEST 1 VIEW COMPARISON:  08/14/2019 FINDINGS: Lungs are adequately inflated and otherwise clear. Cardiomediastinal silhouette and remainder of the exam is unchanged. IMPRESSION: No active disease. Electronically Signed   By: Elberta Fortis M.D.   On: 08/15/2019 06:07   Dg Chest Port 1 View  Result Date: 08/14/2019 CLINICAL DATA:  33 year old female with vomiting EXAM: PORTABLE CHEST 1 VIEW COMPARISON:  None. FINDINGS: The heart size and mediastinal contours are within normal limits. Both lungs are clear. The visualized skeletal structures are unremarkable. IMPRESSION: Negative for acute cardiopulmonary disease Electronically Signed   By: Gilmer Mor D.O.   On: 08/14/2019 09:19   @IMAGES @  Assessment:  1. UGI bleed. DDx includes mallory weiss tear, PUD, variceal bleed, mechanical gastritis.  2. Severe post-hemorrhagic anemia. Transfusions ordered. Patient now hemodynamically stable.   COVID-19 status: Tested Negative  Recommendations:  1. EGD with possible endoscopic hemostasis. The patient understands the nature of the planned procedure, indications, risks, alternatives and potential complications including but not limited to bleeding, infection, perforation, damage to internal organs and possible oversedation/side effects from anesthesia. The patient agrees and gives consent to proceed.  Please refer to procedure notes for findings, recommendations and patient disposition/instructions.     Thank you for the consult. Please call with questions or concerns.  , "Rosina Lowenstein MD [facility] [address] [place], [place] [phone]  08/15/2019 8:10 AM

## 2019-08-15 NOTE — Anesthesia Post-op Follow-up Note (Signed)
Anesthesia QCDR form completed.        

## 2019-08-15 NOTE — Anesthesia Preprocedure Evaluation (Signed)
Anesthesia Evaluation  Patient identified by MRN, date of birth, ID band Patient awake    Reviewed: Allergy & Precautions, NPO status , Patient's Chart, lab work & pertinent test results  Airway Mallampati: II       Dental   Pulmonary Current Smoker,           Cardiovascular negative cardio ROS       Neuro/Psych negative neurological ROS  negative psych ROS   GI/Hepatic GERD  ,(+)     substance abuse  alcohol use,   Endo/Other  negative endocrine ROS  Renal/GU negative Renal ROS  negative genitourinary   Musculoskeletal negative musculoskeletal ROS (+)   Abdominal   Peds negative pediatric ROS (+)  Hematology negative hematology ROS (+)   Anesthesia Other Findings   Reproductive/Obstetrics                             Anesthesia Physical Anesthesia Plan  ASA: II and emergent  Anesthesia Plan: General   Post-op Pain Management:    Induction: Intravenous, Rapid sequence and Cricoid pressure planned  PONV Risk Score and Plan:   Airway Management Planned:   Additional Equipment:   Intra-op Plan:   Post-operative Plan: Extubation in OR  Informed Consent: I have reviewed the patients History and Physical, chart, labs and discussed the procedure including the risks, benefits and alternatives for the proposed anesthesia with the patient or authorized representative who has indicated his/her understanding and acceptance.     Dental advisory given  Plan Discussed with: CRNA and Surgeon  Anesthesia Plan Comments:         Anesthesia Quick Evaluation

## 2019-08-15 NOTE — Op Note (Signed)
[facility] Patient Name: Gail SalmonsJennifer Dimario Procedure Date: 08/15/2019 8:48 AM MRN: [id number] Account #: [id number] Date of Birth: [birth year] Admit Type: Inpatient Age: 89 [facility] 4 Gender: Female Note Status: Finalized Procedure:             Upper GI endoscopy Indications:           Acute post hemorrhagic anemia, Hematemesis, Melena,                         Recent gastrointestinal bleeding Providers:             Boykin Nearingeodoro K. Iysha Mishkin MD, MD Medicines:             Propofol per Anesthesia Complications:         No immediate complications. Estimated blood loss: None. Procedure:             Pre-Anesthesia Assessment:                        - The risks and benefits of the procedure and the                         sedation options and risks were discussed with the                         patient. All questions were answered and informed                         consent was obtained.                        - Patient identification and proposed procedure were                         verified prior to the procedure by the nurse. The                         procedure was verified in the procedure room.                        - ASA Grade Assessment: III - A patient with severe                         systemic disease.                        - After reviewing the risks and benefits, the patient                         was deemed in satisfactory condition to undergo the                         procedure.                        After obtaining informed consent, the endoscope was                         passed under direct vision. Throughout the procedure,  the patient's blood pressure, pulse, and oxygen                         saturations were monitored continuously. The Endoscope                         was introduced through the mouth, and advanced to the                         third part of duodenum. The upper GI endoscopy  was                         accomplished without difficulty. The patient tolerated                         the procedure well. Findings:      A 10 mm non-bleeding Mallory-Weiss tear with stigmata of recent bleeding       was found. Area was successfully injected with 2 mL of a 1:10,000       solution of epinephrine for hemostasis. Coagulation for bleeding       prevention using bipolar probe was successful. Estimated blood loss:       none.      Segmental mild inflammation characterized by congestion (edema) and       erythema was found in the gastric body.      The examined duodenum was normal.      The exam was otherwise without abnormality. Impression:            - Mallory-Weiss tear. Injected. Treated with bipolar                         cautery.                        - Gastritis.                        - Normal examined duodenum.                        - No specimens collected. Recommendation:        - Patient has a contact number available for                         emergencies. The signs and symptoms of potential                         delayed complications were discussed with the patient.                         Return to normal activities tomorrow. Written                         discharge instructions were provided to the patient.                        - Clear liquid diet.                        - Return patient to hospital ward for observation. Procedure  Code(s):     --- Professional ---                        [phone], Esophagogastroduodenoscopy, flexible,                         transoral; with control of bleeding, any method Diagnosis Code(s):     --- Professional ---                        K92.2, Gastrointestinal hemorrhage, unspecified                        K92.1, Melena (includes Hematochezia)                        K92.0, Hematemesis                        D62, Acute posthemorrhagic anemia                        K29.70, Gastritis, unspecified, without bleeding                         K22.6, Gastro-esophageal laceration-hemorrhage syndrome CPT copyright 2019 American Medical Association. All rights reserved. The codes documented in this report are preliminary and upon coder review may  be revised to meet current compliance requirements. Efrain Sella MD, MD 08/15/2019 9:10:01 AM This report has been signed electronically. Number of Addenda: 0 Note Initiated On: 08/15/2019 8:48 AM Estimated Blood Loss:  Estimated blood loss: none. Estimated blood loss:                         none. Estimated blood loss: none.      [facility]

## 2019-08-15 NOTE — ED Notes (Signed)
Endo here to get patient however pt already on the way to ICU per Gail Mullins. Endo notified to get patient from ICU. Endo reports did not call prior to arrival regarding transport.

## 2019-08-15 NOTE — Interval H&P Note (Signed)
History and Physical Interval Note:  08/15/2019 8:24 AM  Gail Mullins  has presented today for surgery, with the diagnosis of Upper GI Bleed.  The various methods of treatment have been discussed with the patient and family. After consideration of risks, benefits and other options for treatment, the patient has consented to  Procedure(s): ESOPHAGOGASTRODUODENOSCOPY (EGD) (N/A) as a surgical intervention.  The patient's history has been reviewed, patient examined, no change in status, stable for surgery.  I have reviewed the patient's chart and labs.  Questions were answered to the patient's satisfaction.     [place], [place]

## 2019-08-15 NOTE — H&P (View-Only) (Signed)
[facility] GI Inpatient Consult Note   Kathline Magic, M.D.  Reason for Consult: UGI bleed   Attending Requesting Consult: Ottie Glazier, M.D.   History of Present Illness: Gail Mullins is a 33 y.o. female presents for UGI bleed. 33 y/o female presents with acute UGI bleed and severe anemia of  6.2 from 11.4 yesterday at her initial ED visit. Patient denies CP, SOB, abdominal pain. Patient drinks a fair amount of ETOH, possibly too much.  Had melenic stool in ED and decr BP into 90's. IV resuscitation performed and BP now low 100's.     Past Medical History:  Past Medical History:  Diagnosis Date  . Alcohol abuse     Problem List: Patient Active Problem List   Diagnosis Date Noted  . Acute gastrointestinal hemorrhage 08/15/2019    Past Surgical History: History reviewed. No pertinent surgical history.  Allergies: No Known Allergies  Home Medications: (Not in a hospital admission)  Home medication reconciliation was completed with the patient.   Scheduled Inpatient Medications:    Continuous Inpatient Infusions:   . sodium chloride 125 mL/hr at 08/15/19 0545  . sodium chloride    . cefTRIAXone (ROCEPHIN)  IV    . octreotide  (SANDOSTATIN)    IV infusion 50 mcg/hr (08/15/19 0537)  . pantoprozole (PROTONIX) infusion 8 mg/hr (08/15/19 0535)    PRN Inpatient Medications:  ondansetron (ZOFRAN) IV  Family History: family history is not on file.   GI Family History: Negative  Social History:   reports that she has been smoking. She has never used smokeless tobacco. She reports current alcohol use. She reports previous drug use. The patient denies ETOH, tobacco, or drug use.    Review of Systems: Review of Systems - Negative except HPI(  Physical Examination: BP (!) 106/51   Pulse 95   Temp 98.7 F (37.1 C)   Resp 12   LMP 08/08/2019 (Exact Date)   SpO2 100%  Physical Exam Constitutional:      General: She is not in acute distress.     Appearance: Normal appearance. She is normal weight. She is ill-appearing. She is not toxic-appearing or diaphoretic.  HENT:     Head: Normocephalic.     Nose: Nose normal.     Mouth/Throat:     Pharynx: Oropharynx is clear.  Eyes:     Extraocular Movements: Extraocular movements intact.     Pupils: Pupils are equal, round, and reactive to light.  Neck:     Musculoskeletal: Normal range of motion.  Cardiovascular:     Rate and Rhythm: Normal rate.     Pulses: Normal pulses.     Heart sounds: No friction rub. No gallop.   Pulmonary:     Effort: Pulmonary effort is normal.     Breath sounds: Normal breath sounds.  Abdominal:     General: Abdomen is flat. Bowel sounds are normal.     Palpations: Abdomen is soft. There is no mass.     Tenderness: There is no abdominal tenderness. There is no guarding or rebound.     Hernia: No hernia is present.  Skin:    Coloration: Skin is pale.  Neurological:     General: No focal deficit present.     Mental Status: She is alert and oriented to person, place, and time.  Psychiatric:        Mood and Affect: Mood normal.        Behavior: Behavior normal.  Thought Content: Thought content normal.        Judgment: Judgment normal.     Data: Lab Results  Component Value Date   WBC 22.2 (H) 08/15/2019   HGB 6.2 (L) 08/15/2019   HCT 18.6 (L) 08/15/2019   MCV 93.5 08/15/2019   PLT 246 08/15/2019   Recent Labs  Lab 08/14/19 0813 08/14/19 1056 08/15/19 0418  HGB 12.5 11.4* 6.2*   Lab Results  Component Value Date   NA 136 08/15/2019   K 4.9 08/15/2019   CL 110 08/15/2019   CO2 19 (L) 08/15/2019   BUN 50 (H) 08/15/2019   CREATININE 0.66 08/15/2019   Lab Results  Component Value Date   ALT 10 08/15/2019   AST 18 08/15/2019   ALKPHOS 31 (L) 08/15/2019   BILITOT 0.7 08/15/2019   Recent Labs  Lab 08/15/19 0418  APTT 27  INR 1.1   CBC Latest Ref Rng & Units 08/15/2019 08/14/2019 08/14/2019  WBC 4.0 - 10.5 K/uL 22.2(H) -  10.9(H)  Hemoglobin 12.0 - 15.0 g/dL 6.2(L) 11.4(L) 12.5  Hematocrit 36.0 - 46.0 % 18.6(L) 35.3(L) 38.7  Platelets 150 - 400 K/uL 246 - 304    STUDIES: Dg Chest Portable 1 View  Result Date: 08/15/2019 CLINICAL DATA:  Syncopal episode with vomiting blood. EXAM: PORTABLE CHEST 1 VIEW COMPARISON:  08/14/2019 FINDINGS: Lungs are adequately inflated and otherwise clear. Cardiomediastinal silhouette and remainder of the exam is unchanged. IMPRESSION: No active disease. Electronically Signed   By: Elberta Fortis M.D.   On: 08/15/2019 06:07   Dg Chest Port 1 View  Result Date: 08/14/2019 CLINICAL DATA:  33 year old female with vomiting EXAM: PORTABLE CHEST 1 VIEW COMPARISON:  None. FINDINGS: The heart size and mediastinal contours are within normal limits. Both lungs are clear. The visualized skeletal structures are unremarkable. IMPRESSION: Negative for acute cardiopulmonary disease Electronically Signed   By: Gilmer Mor D.O.   On: 08/14/2019 09:19   @IMAGES @  Assessment:  1. UGI bleed. DDx includes mallory weiss tear, PUD, variceal bleed, mechanical gastritis.  2. Severe post-hemorrhagic anemia. Transfusions ordered. Patient now hemodynamically stable.   COVID-19 status: Tested Negative  Recommendations:  1. EGD with possible endoscopic hemostasis. The patient understands the nature of the planned procedure, indications, risks, alternatives and potential complications including but not limited to bleeding, infection, perforation, damage to internal organs and possible oversedation/side effects from anesthesia. The patient agrees and gives consent to proceed.  Please refer to procedure notes for findings, recommendations and patient disposition/instructions.     Thank you for the consult. Please call with questions or concerns.  , "Rosina Lowenstein MD [facility] [address] [place], [place] [phone]  08/15/2019 8:10 AM

## 2019-08-15 NOTE — ED Triage Notes (Signed)
Pt arrives via [facility] with c/o syncopal episodes and vomiting blood, dary stools. Pt seen recently for the same, referred to GI. Initial BP 90 SBP, given approx 331ml fluids in the left hand, improved to 106SBP. CBG 226.

## 2019-08-15 NOTE — Consult Note (Deleted)
Outpatient short stay form Pre-procedure 08/15/2019 8:04 AM Gail Mullins K. Alice Reichert, M.D.  Primary Physician: Ottie Glazier, M.D.  Reason for visit:  UGI bleed  History of present illness:    Current Facility-Administered Medications:  .  [COMPLETED] sodium chloride 0.9 % bolus 1,000 mL, 1,000 mL, Intravenous, Once, Stopped at 08/15/19 0556 **AND** 0.9 %  sodium chloride infusion, , Intravenous, Continuous, Hinda Kehr, MD, Last Rate: 125 mL/hr at 08/15/19 0545 .  0.9 %  sodium chloride infusion, 10 mL/hr, Intravenous, Once, Hinda Kehr, MD .  cefTRIAXone (ROCEPHIN) 1 g in sodium chloride 0.9 % 100 mL IVPB, 1 g, Intravenous, STAT, Hinda Kehr, MD .  octreotide (SANDOSTATIN) 500 mcg in sodium chloride 0.9 % 250 mL (2 mcg/mL) infusion, 50 mcg/hr, Intravenous, Continuous, Hinda Kehr, MD, Last Rate: 25 mL/hr at 08/15/19 0537, 50 mcg/hr at 08/15/19 0537 .  ondansetron (ZOFRAN) injection 4 mg, 4 mg, Intravenous, Q6H PRN, Tukov-Yual, Magdalene S, NP .  pantoprazole (PROTONIX) 80 mg in sodium chloride 0.9 % 250 mL (0.32 mg/mL) infusion, 8 mg/hr, Intravenous, Continuous, Hinda Kehr, MD, Last Rate: 25 mL/hr at 08/15/19 0535, 8 mg/hr at 08/15/19 0535  Current Outpatient Medications:  .  omeprazole (PRILOSEC) 40 MG capsule, Take 1 capsule (40 mg total) by mouth daily., Disp: 60 capsule, Rfl: 0 .  ondansetron (ZOFRAN) 4 MG tablet, Take 1 tablet (4 mg total) by mouth every 8 (eight) hours as needed for up to 7 days for nausea or vomiting., Disp: 21 tablet, Rfl: 0  (Not in a hospital admission)    No Known Allergies   Past Medical History:  Diagnosis Date  . Alcohol abuse     Review of systems:  Otherwise negative.    Physical Exam  Gen: Alert, oriented. Appears stated age.  HEENT: [place]/AT. PERRLA. Lungs: CTA, no wheezes. CV: RR nl S1, S2. Abd: soft, benign, no masses. BS+ Ext: No edema. Pulses 2+    Planned procedures: Proceed with urgent EGD.  The patient understands the nature  of the planned procedure, indications, risks, alternatives and potential complications including but not limited to bleeding, infection, perforation, damage to internal organs and possible oversedation/side effects from anesthesia. The patient agrees and gives consent to proceed.  Please refer to procedure notes for findings, recommendations and patient disposition/instructions.     [name] K. Alice Reichert, M.D. Gastroenterology 08/15/2019  8:04 AM

## 2019-08-15 NOTE — Transfer of Care (Signed)
Immediate Anesthesia Transfer of Care Note  Patient: Elain Wixon  Procedure(s) Performed: ESOPHAGOGASTRODUODENOSCOPY (EGD) (N/A )  Patient Location: PACU  Anesthesia Type:General  Level of Consciousness: sedated  Airway & Oxygen Therapy: Patient Spontanous Breathing and Patient connected to face mask oxygen  Post-op Assessment: Report given to RN and Post -op Vital signs reviewed and stable  Post vital signs: Reviewed and stable  Last Vitals:  Vitals Value Taken Time  BP    Temp    Pulse    Resp    SpO2      Last Pain:  Vitals:   08/15/19 0820  TempSrc: Tympanic  PainSc: 0-No pain         Complications: No apparent anesthesia complications

## 2019-08-15 NOTE — Consult Note (Signed)
CRITICAL CARE PROGRESS NOTE    Name: Linus SalmonsJennifer Fick MRN: [id number] DOB: [birth year]     LOS: 0   SUBJECTIVE FINDINGS & SIGNIFICANT EVENTS   Patient description:   This is a 33 year old female with a history of alcoholism dating back several years, states that she has cut down over the last 4 years, she has chronic headaches reports taking NSAIDs including Advil and Excedrin.  She has been in the emergency room twice in the past week for presyncopal episodes with the most recent presentation of recurrent syncope at home large volume hematemesis and melanotic stools post initial discharge from ED.  Found to have acute blood loss anemia requiring blood transfusion.  She is status post EGD after being evaluated by GI with evidence of 10 mm Mallory-Weiss tear without active bleeding.  Initial vitals included severe hypotension including BP of 89/45 , tachycardia at a heart rate of 145 and severe tachypnea with a respiratory rate of 29.  Received blood transfusion and post EGD was transferred to the medical intensive care unit for further management of upper GI bleeding.   Lines / Drains: PIV x2  Cultures / Sepsis markers None  Antibiotics: Rocephin   Protocols / Consultants: GI  Tests / Events: Status post EGD     PAST MEDICAL HISTORY   Past Medical History:  Diagnosis Date  . Alcohol abuse      SURGICAL HISTORY   History reviewed. No pertinent surgical history.   FAMILY HISTORY   History reviewed. No pertinent family history.   SOCIAL HISTORY   Social History   Tobacco Use  . Smoking status: Current Every Day Smoker  . Smokeless tobacco: Never Used  Substance Use Topics  . Alcohol use: Yes  . Drug use: Not Currently     MEDICATIONS   Current Medication:  Current  Facility-Administered Medications:  .  [COMPLETED] sodium chloride 0.9 % bolus 1,000 mL, 1,000 mL, Intravenous, Once, Stopped at 08/15/19 0556 **AND** 0.9 %  sodium chloride infusion, , Intravenous, Continuous, Loleta RoseForbach, Cory, MD, Last Rate: 125 mL/hr at 08/15/19 1144 .  0.9 %  sodium chloride infusion, 10 mL/hr, Intravenous, Once, Loleta RoseForbach, Cory, MD .  cefTRIAXone (ROCEPHIN) 1 g in sodium chloride 0.9 % 100 mL IVPB, 1 g, Intravenous, STAT, Loleta RoseForbach, Cory, MD .  EPINEPHrine (ADRENALIN) 1 MG/10ML injection, , , ,  .  octreotide (SANDOSTATIN) 500 mcg in sodium chloride 0.9 % 250 mL (2 mcg/mL) infusion, 50 mcg/hr, Intravenous, Continuous, Loleta RoseForbach, Cory, MD, Last Rate: 25 mL/hr at 08/15/19 0537, 50 mcg/hr at 08/15/19 0537 .  ondansetron (ZOFRAN) injection 4 mg, 4 mg, Intravenous, Q6H PRN, Tukov-Yual, Magdalene S, NP, 4 mg at 08/15/19 0840 .  pantoprazole (PROTONIX) 80 mg in sodium chloride 0.9 % 250 mL (0.32 mg/mL) infusion, 8 mg/hr, Intravenous, Continuous, Loleta RoseForbach, Cory, MD, Last Rate: 25 mL/hr at 08/15/19 1150, 8 mg/hr at 08/15/19 1150    ALLERGIES   Patient has no known allergies.    REVIEW OF SYSTEMS    10 point ROS done and is negative except for left shoulder pain  PHYSICAL EXAMINATION   Vital Signs: Temp:  [96.9 F (36.1 C)-98.7 F (37.1 C)] 96.9 F (36.1 C) (11/08 0820) Pulse Rate:  [85-124] 85 (11/08 0820) Resp:  [10-29] 18 (11/08 0958) BP: (89-117)/(41-57) 115/56 (11/08 0820) SpO2:  [96 %-100 %] 100 % (11/08 0820) Weight:  [68 kg] 68 kg (11/08 0820)  GENERAL: Pallor, no apparent distress HEAD: Normocephalic, atraumatic.  EYES: Pupils equal, round, reactive to  light.  No scleral icterus.  MOUTH: Moist mucosal membrane. NECK: Supple. No thyromegaly. No nodules. No JVD.  PULMONARY: Clear to auscultation bilaterally CARDIOVASCULAR: S1 and S2. Regular rate and rhythm. No murmurs, rubs, or gallops.  GASTROINTESTINAL: Soft, nontender, non-distended. No masses. Positive bowel  sounds. No hepatosplenomegaly.  MUSCULOSKELETAL: No swelling, clubbing, or edema.  NEUROLOGIC: Mild distress due to acute illness SKIN:intact,warm,dry   PERTINENT DATA     Infusions: . sodium chloride 125 mL/hr at 08/15/19 1144  . sodium chloride    . cefTRIAXone (ROCEPHIN)  IV    . octreotide  (SANDOSTATIN)    IV infusion 50 mcg/hr (08/15/19 0537)  . pantoprozole (PROTONIX) infusion 8 mg/hr (08/15/19 1150)   Scheduled Medications: . EPINEPHrine       PRN Medications: ondansetron (ZOFRAN) IV Hemodynamic parameters:   Intake/Output: 11/07 0701 - 11/08 0700 In: 250 [I.V.:250] Out: -   Ventilator  Settings:      LAB RESULTS:  Basic Metabolic Panel: Recent Labs  Lab 08/14/19 0813 08/15/19 0418  NA 140 136  K 4.7 4.9  CL 107 110  CO2 21* 19*  GLUCOSE 102* 154*  BUN 26* 50*  CREATININE 0.61 0.66  CALCIUM 9.2 8.1*   Liver Function Tests: Recent Labs  Lab 08/14/19 0813 08/15/19 0418  AST 21 18  ALT 17 10  ALKPHOS 54 31*  BILITOT 0.7 0.7  PROT 8.1 5.3*  ALBUMIN 4.7 3.1*   Recent Labs  Lab 08/14/19 0813 08/15/19 0418  LIPASE 27 25   Recent Labs  Lab 08/15/19 0509  AMMONIA <9*   CBC: Recent Labs  Lab 08/14/19 0813 08/14/19 1056 08/15/19 0418 08/15/19 1053  WBC 10.9*  --  22.2* 12.6*  NEUTROABS  --   --   --  11.1*  HGB 12.5 11.4* 6.2* 7.2*  HCT 38.7 35.3* 18.6* 21.4*  MCV 93.7  --  93.5 91.5  PLT 304  --  246 183   Cardiac Enzymes: No results for input(s): CKTOTAL, CKMB, CKMBINDEX, TROPONINI in the last 168 hours. BNP: Invalid input(s): POCBNP CBG: No results for input(s): GLUCAP in the last 168 hours.   IMAGING RESULTS:  Imaging: Dg Chest Portable 1 View  Result Date: 08/15/2019 CLINICAL DATA:  Syncopal episode with vomiting blood. EXAM: PORTABLE CHEST 1 VIEW COMPARISON:  08/14/2019 FINDINGS: Lungs are adequately inflated and otherwise clear. Cardiomediastinal silhouette and remainder of the exam is unchanged. IMPRESSION: No  active disease. Electronically Signed   By: Marin Olp M.D.   On: 08/15/2019 06:07   Dg Chest Port 1 View  Result Date: 08/14/2019 CLINICAL DATA:  33 year old female with vomiting EXAM: PORTABLE CHEST 1 VIEW COMPARISON:  None. FINDINGS: The heart size and mediastinal contours are within normal limits. Both lungs are clear. The visualized skeletal structures are unremarkable. IMPRESSION: Negative for acute cardiopulmonary disease Electronically Signed   By: Corrie Mckusick D.O.   On: 08/14/2019 09:19   EGD findings:       ASSESSMENT AND PLAN      Acute blood loss anemia due to upper GI bleed -GI on case-appreciate input -Mallory-Weiss tear -Continue Protonix and octreotide gtt. -Monitor H&H -status post PRBC -Transfuse as needed-with a goal hemoglobin of 7   Alcoholism   -Monitor for signs of withdrawal  -Banana bag with thiamine and folate repletion ICU -telemetry monitoring  ID -continue IV abx as prescibed -follow up cultures  GI/Nutrition GI PROPHYLAXIS as indicated-Protonix DIET-->TF's as tolerated Constipation protocol as indicated  ENDO - ICU hypoglycemic\Hyperglycemia protocol -  check FSBS per protocol   ELECTROLYTES -follow labs as needed -replace as needed -pharmacy consultation   DVT/GI PRX ordered -SCDs  TRANSFUSIONS AS NEEDED MONITOR FSBS ASSESS the need for LABS as needed   Critical care provider statement:    Critical care time (minutes):  32   Critical care time was exclusive of:  Separately billable procedures and treating other patients   Critical care was necessary to treat or prevent imminent or life-threatening deterioration of the following conditions:   Acute blood loss anemia, GI bleed, Mallory-Weiss tear, alcoholism   Critical care was time spent personally by me on the following activities:  Development of treatment plan with patient or surrogate, discussions with consultants, evaluation of patient's response to treatment,  examination of patient, obtaining history from patient or surrogate, ordering and performing treatments and interventions, ordering and review of laboratory studies and re-evaluation of patient's condition.  I assumed direction of critical care for this patient from another provider in my specialty: no    This document was prepared using Dragon voice recognition software and may include unintentional dictation errors.    Vida Rigger, M.D.  Division of Pulmonary & Critical Care Medicine  [facility]

## 2019-08-16 LAB — CBC
HCT: 22.3 % — ABNORMAL LOW (ref 36.0–46.0)
HCT: 22.1 % — ABNORMAL LOW (ref 36.0–46.0)
HCT: 22.7 % — ABNORMAL LOW (ref 36.0–46.0)
HCT: 24.3 % — ABNORMAL LOW (ref 36.0–46.0)
Hemoglobin: 7.4 g/dL — ABNORMAL LOW (ref 12.0–15.0)
Hemoglobin: 7.4 g/dL — ABNORMAL LOW (ref 12.0–15.0)
Hemoglobin: 7.5 g/dL — ABNORMAL LOW (ref 12.0–15.0)
Hemoglobin: 8 g/dL — ABNORMAL LOW (ref 12.0–15.0)
MCH: 29.2 pg (ref 26.0–34.0)
MCH: 29.6 pg (ref 26.0–34.0)
MCH: 29.5 pg (ref 26.0–34.0)
MCH: 30 pg (ref 26.0–34.0)
MCHC: 33.2 g/dL (ref 30.0–36.0)
MCHC: 33.5 g/dL (ref 30.0–36.0)
MCHC: 33 g/dL (ref 30.0–36.0)
MCHC: 32.9 g/dL (ref 30.0–36.0)
MCV: 88.1 fL (ref 80.0–100.0)
MCV: 88.4 fL (ref 80.0–100.0)
MCV: 89.4 fL (ref 80.0–100.0)
MCV: 91 fL (ref 80.0–100.0)
Platelets: 157 10*3/uL (ref 150–400)
Platelets: 150 10*3/uL (ref 150–400)
Platelets: 153 10*3/uL (ref 150–400)
Platelets: 174 10*3/uL (ref 150–400)
RBC: 2.53 MIL/uL — ABNORMAL LOW (ref 3.87–5.11)
RBC: 2.5 MIL/uL — ABNORMAL LOW (ref 3.87–5.11)
RBC: 2.54 MIL/uL — ABNORMAL LOW (ref 3.87–5.11)
RBC: 2.67 MIL/uL — ABNORMAL LOW (ref 3.87–5.11)
RDW: 15.7 % — ABNORMAL HIGH (ref 11.5–15.5)
RDW: 16.6 % — ABNORMAL HIGH (ref 11.5–15.5)
RDW: 16.7 % — ABNORMAL HIGH (ref 11.5–15.5)
RDW: 16.8 % — ABNORMAL HIGH (ref 11.5–15.5)
WBC: 11.3 10*3/uL — ABNORMAL HIGH (ref 4.0–10.5)
WBC: 9.5 10*3/uL (ref 4.0–10.5)
WBC: 8.8 10*3/uL (ref 4.0–10.5)
WBC: 9.2 10*3/uL (ref 4.0–10.5)
nRBC: 0 % (ref 0.0–0.2)
nRBC: 0 % (ref 0.0–0.2)
nRBC: 0 % (ref 0.0–0.2)
nRBC: 0 % (ref 0.0–0.2)

## 2019-08-16 LAB — GLUCOSE, CAPILLARY: Glucose-Capillary: 165 mg/dL — ABNORMAL HIGH (ref 70–99)

## 2019-08-16 LAB — URINE CULTURE: Culture: <10000 — AB

## 2019-08-16 LAB — MRSA PCR SCREENING: MRSA by PCR: NEGATIVE

## 2019-08-16 MED ORDER — SERTRALINE HCL 50 MG PO TABS
25.0000 mg | ORAL_TABLET | Freq: Every day | ORAL | Status: DC
  Administered 2019-08-16 – 2019-08-17 (×2): 25 mg via ORAL
  Filled 2019-08-16 (×2): qty 1

## 2019-08-17 ENCOUNTER — Encounter: Payer: Self-pay | Admitting: Internal Medicine

## 2019-08-17 DIAGNOSIS — F419 Anxiety disorder, unspecified: Secondary | ICD-10-CM | POA: Diagnosis present

## 2019-08-17 DIAGNOSIS — F1010 Alcohol abuse, uncomplicated: Secondary | ICD-10-CM

## 2019-08-17 DIAGNOSIS — K219 Gastro-esophageal reflux disease without esophagitis: Secondary | ICD-10-CM

## 2019-08-17 DIAGNOSIS — K922 Gastrointestinal hemorrhage, unspecified: Secondary | ICD-10-CM

## 2019-08-17 DIAGNOSIS — D62 Acute posthemorrhagic anemia: Secondary | ICD-10-CM | POA: Diagnosis present

## 2019-08-17 DIAGNOSIS — F17210 Nicotine dependence, cigarettes, uncomplicated: Secondary | ICD-10-CM | POA: Diagnosis present

## 2019-08-17 DIAGNOSIS — F1011 Alcohol abuse, in remission: Secondary | ICD-10-CM | POA: Diagnosis present

## 2019-08-17 DIAGNOSIS — Z720 Tobacco use: Secondary | ICD-10-CM | POA: Diagnosis present

## 2019-08-17 LAB — CBC
HCT: 23.1 % — ABNORMAL LOW (ref 36.0–46.0)
Hemoglobin: 7.4 g/dL — ABNORMAL LOW (ref 12.0–15.0)
MCH: 29.7 pg (ref 26.0–34.0)
MCHC: 32 g/dL (ref 30.0–36.0)
MCV: 92.8 fL (ref 80.0–100.0)
Platelets: 174 10*3/uL (ref 150–400)
RBC: 2.49 MIL/uL — ABNORMAL LOW (ref 3.87–5.11)
RDW: 16.6 % — ABNORMAL HIGH (ref 11.5–15.5)
WBC: 8.3 10*3/uL (ref 4.0–10.5)
nRBC: 0 % (ref 0.0–0.2)

## 2019-08-17 LAB — BASIC METABOLIC PANEL
Anion gap: 4 — ABNORMAL LOW (ref 5–15)
BUN: 9 mg/dL (ref 6–20)
CO2: 24 mmol/L (ref 22–32)
Calcium: 8.1 mg/dL — ABNORMAL LOW (ref 8.9–10.3)
Chloride: 112 mmol/L — ABNORMAL HIGH (ref 98–111)
Creatinine, Ser: 0.7 mg/dL (ref 0.44–1.00)
GFR calc Af Amer: >60 mL/min (ref >60)
GFR calc non Af Amer: >60 mL/min (ref >60)
Glucose, Bld: 96 mg/dL (ref 70–99)
Potassium: 3.8 mmol/L (ref 3.5–5.1)
Sodium: 140 mmol/L (ref 135–145)

## 2019-08-17 LAB — TYPE AND SCREEN
ABO/RH(D): A POS
Antibody Screen: NEGATIVE
Unit division: 0
Unit division: 0
Unit division: 0

## 2019-08-17 LAB — MAGNESIUM: Magnesium: 2.1 mg/dL (ref 1.7–2.4)

## 2019-08-17 LAB — BPAM RBC
Blood Product Expiration Date: 202012042359
Blood Product Expiration Date: 202012042359
Blood Product Expiration Date: 202012042359
ISSUE DATE / TIME: 202011080547
ISSUE DATE / TIME: 202011082013
Unit Type and Rh: 6200
Unit Type and Rh: 6200
Unit Type and Rh: 6200

## 2019-08-17 LAB — PHOSPHORUS: Phosphorus: 2.2 mg/dL — ABNORMAL LOW (ref 2.5–4.6)

## 2019-08-17 LAB — PREPARE RBC (CROSSMATCH)

## 2019-08-17 MED ORDER — FOLIC ACID 1 MG PO TABS
1.0000 mg | ORAL_TABLET | Freq: Every day | ORAL | Status: DC
  Administered 2019-08-17: 10:00:00 1 mg via ORAL
  Filled 2019-08-17: qty 1

## 2019-08-17 MED ORDER — LORAZEPAM 2 MG/ML IJ SOLN: 0.0000 mg | Freq: Two times a day (BID) | INTRAMUSCULAR | Status: DC

## 2019-08-17 MED ORDER — NICOTINE 21 MG/24HR TD PT24: 21.0000 mg | MEDICATED_PATCH | Freq: Every day | TRANSDERMAL | 0 refills | Status: DC

## 2019-08-17 MED ORDER — DOCUSATE SODIUM 100 MG PO CAPS: 100.0000 mg | ORAL_CAPSULE | Freq: Every day | ORAL | 1 refills | Status: AC | PRN

## 2019-08-17 MED ORDER — ADULT MULTIVITAMIN W/MINERALS CH
1.0000 | ORAL_TABLET | Freq: Every day | ORAL | Status: DC
  Administered 2019-08-17: 1 via ORAL
  Filled 2019-08-17: qty 1

## 2019-08-17 MED ORDER — OMEPRAZOLE 40 MG PO CPDR: 40.0000 mg | DELAYED_RELEASE_CAPSULE | Freq: Every day | ORAL | 1 refills | Status: DC

## 2019-08-17 MED ORDER — LORAZEPAM 2 MG/ML IJ SOLN: 0.0000 mg | Freq: Four times a day (QID) | INTRAMUSCULAR | Status: DC

## 2019-08-17 MED ORDER — FOLIC ACID 1 MG PO TABS: 1.0000 mg | ORAL_TABLET | Freq: Every day | ORAL | 1 refills | Status: DC

## 2019-08-17 MED ORDER — PANTOPRAZOLE SODIUM 40 MG PO TBEC: 40.0000 mg | DELAYED_RELEASE_TABLET | Freq: Two times a day (BID) | ORAL | 1 refills | Status: DC

## 2019-08-17 MED ORDER — ADULT MULTIVITAMIN W/MINERALS CH: 1.0000 | ORAL_TABLET | Freq: Every day | ORAL | 1 refills | Status: DC

## 2019-08-17 MED ORDER — LORAZEPAM 1 MG PO TABS: 1.0000 mg | ORAL_TABLET | ORAL | Status: DC | PRN

## 2019-08-17 MED ORDER — VITAMIN B-1 100 MG PO TABS
100.0000 mg | ORAL_TABLET | Freq: Every day | ORAL | Status: DC
  Administered 2019-08-17: 10:00:00 100 mg via ORAL
  Filled 2019-08-17: qty 1

## 2019-08-17 MED ORDER — THIAMINE HCL 100 MG PO TABS: 100.0000 mg | ORAL_TABLET | Freq: Every day | ORAL | 1 refills | Status: DC

## 2019-08-17 MED ORDER — THIAMINE HCL 100 MG/ML IJ SOLN: 100.0000 mg | Freq: Every day | INTRAMUSCULAR | Status: DC

## 2019-08-17 MED ORDER — FERROUS SULFATE 325 (65 FE) MG PO TABS: 325.0000 mg | ORAL_TABLET | Freq: Every day | ORAL | 2 refills | Status: DC

## 2019-08-17 MED ORDER — NICOTINE 21 MG/24HR TD PT24: 21.0000 mg | MEDICATED_PATCH | Freq: Every day | TRANSDERMAL | Status: DC

## 2019-08-17 MED ORDER — SERTRALINE HCL 25 MG PO TABS: 25.0000 mg | ORAL_TABLET | Freq: Every day | ORAL | 1 refills | Status: DC

## 2019-08-17 MED ORDER — POTASSIUM & SODIUM PHOSPHATES 280-160-250 MG PO PACK
1.0000 | PACK | Freq: Three times a day (TID) | ORAL | Status: DC
  Administered 2019-08-17: 1 via ORAL
  Filled 2019-08-17 (×2): qty 1

## 2019-08-17 MED ORDER — LORAZEPAM 2 MG/ML IJ SOLN: 1.0000 mg | INTRAMUSCULAR | Status: DC | PRN

## 2019-08-17 NOTE — TOC Transition Note (Addendum)
Transition of Care [facility]) - CM/SW Discharge Note   Patient Details  Name: Landis Cassaro MRN: [id number] Date of Birth: [birth year]  Transition of Care [facility]) CM/SW Contact:  Zamyra Allensworth, Lenice Llamas Phone Number: [phone]  08/17/2019, 4:29 PM   Clinical Narrative: Clinical Social Worker (CSW) met with patient and her significant other Thurmond Butts was at bedside. Patient was alert and oriented X4 and was sitting up in the bed. CSW introduced self and explained role of CSW department. Per patient she works full time and lives in [place] alone. Patient reported that she does not have a PCP but she is going to look at [employer] website to search for in network providers. Patient reported that she has a psychiatrist in [facility] and has an appointment in the next 2 weeks. Patient reported that she also has a therapist. Patient reported that her alcohol use is an issue and she is going to stop drinking. CSW provided emotional support. Patient reported she has no children. Per patient she has been diagnosed with depression and anxiety. Patient reported that she is feeling good today and is ready to D/C home. Patient reported that she is not having thoughts of hurting herself or anybody else. Per patient she has no housing or transportation concerns. Patient denied drug use. Patient reported no other issues or concerns. Please reconsult if future social work needs arise. CSW signing off.     Final next level of care: Home/Self Care Barriers to Discharge: Barriers Resolved   Patient Goals and CMS Choice Patient states their goals for this hospitalization and ongoing recovery are:: To go home.      Discharge Placement                       Discharge Plan and Services In-house Referral: Clinical Social Work              DME Arranged: N/A         HH Arranged: NA          Social Determinants of Health (SDOH) Interventions     Readmission Risk Interventions No flowsheet data  found.

## 2019-08-17 NOTE — Progress Notes (Signed)
All discharge instructions discussed with pt, pt acknowledged understanding of discharge instructions. All ADL's removed. Pt is stable and ready for discharge. All questions answered. Pt denies questions or concerns at this time.

## 2019-08-17 NOTE — Progress Notes (Signed)
Shift summary:  - CIWA assessments added. - No acute changes.

## 2019-08-17 NOTE — Discharge Instructions (Signed)
You were cared for by a hospitalist during your hospital stay. If you have any questions about your discharge medications or the care you received while you were in the hospital after you are discharged, you can call the unit and ask to speak with the hospitalist on call if the hospitalist that took care of you is not available. Once you are discharged, your primary care physician will handle any further medical issues. Please note that NO REFILLS for any discharge medications will be authorized once you are discharged, as it is imperative that you return to your primary care physician (or establish a relationship with a primary care physician if you do not have one) for your aftercare needs so that they can reassess your need for medications and monitor your lab values.  Follow up with PCP and gastrointerologist. Take all medications as prescribed. If symptoms change or worsen please return to the ED for evaluation   As you promised to me that you will find your own PCP to follow up within 1 week.

## 2019-08-17 NOTE — Discharge Summary (Addendum)
Physician Discharge Summary  Maragret Vanacker ZOX:[id number] DOB: 1985/11/15 DOA: 08/15/2019  PCP: Patient, No Pcp Per  Admit date: 08/15/2019 Discharge date: 08/17/2019  Recommendations for Outpatient Follow-up:  1. Follow up with PCP in 5-7 days and gastrointerologist in 2 weeks 2. Please obtain CBC in one week to check Hgb level  Home Health: none Equipment/Devices: none    Discharge Condition: stable CODE STATUS: full Diet recommendation: regular  Brief/Interim Summary (HPI) This is a 33 year old female with a history of alcoholism dating back several years, states that she has cut down over the last 4 years, she has chronic headaches reports taking NSAIDs including Advil and Excedrin.  She has been in the emergency room twice in the past week for presyncopal episodes with the most recent presentation of recurrent syncope at home large volume hematemesis and melanotic stools post initial discharge from ED.  Found to have acute blood loss anemia requiring blood transfusion.  She is status post EGD after being evaluated by GI with evidence of 10 mm Mallory-Weiss tear without active bleeding.  Initial vitals included severe hypotension including BP of 89/45 , tachycardia at a heart rate of 145 and severe tachypnea with a respiratory rate of 29.  Received blood transfusion and post EGD was transferred to the medical intensive care unit for further management of upper GI bleeding.   Discharge Diagnoses and Hospital Course:   Active Problems: Acute gastrointestinal hemorrhage and  acute blood loss anemia: hgb dropped from 12.5 on 08/14/19 to 6.2 on admission. Pt was transfused with blood (922 and 340 ml). Hgb back to 7.4-8.0. her Hgb is 7.4 at discharge.  GI was consulted. EGD was done which showed mallory-Weiss tear, teated with bipolar cautery, also showed gastritis. No active rectal bleeding or hematemesis at discharge. No CP or SOB.  -will d/c on protonix 40 mg bid -iron supplement -prn  Colace for constipation.  -Pt stats that she will find her own PCP and follow up within 7 days.  Tobacco abuse and Alcohol abuse: -Did counseling about importance of quitting smoking -Nicotine patch -Did counseling about the importance of quitting drinking -CIWA protocol ordered in hospital.  GERD (gastroesophageal reflux disease): -Protonix.  Anxiety; -continue Zoloft   Discharge Instructions  Discharge Instructions    Call MD for:  difficulty breathing, headache or visual disturbances   Complete by: As directed    Call MD for:  persistant dizziness or light-headedness   Complete by: As directed    Call MD for:  severe uncontrolled pain   Complete by: As directed    Call MD for:  temperature >100.4   Complete by: As directed    Diet general   Complete by: As directed    Increase activity slowly   Complete by: As directed      Allergies as of 08/17/2019   No Known Allergies     Medication List    STOP taking these medications   omeprazole 40 MG capsule Commonly known as: PriLOSEC   ondansetron 4 MG tablet Commonly known as: ZOFRAN     TAKE these medications   docusate sodium 100 MG capsule Commonly known as: Colace Take 1 capsule (100 mg total) by mouth daily as needed.   ferrous sulfate 325 (65 FE) MG tablet Take 1 tablet (325 mg total) by mouth daily with breakfast.   folic acid 1 MG tablet Commonly known as: FOLVITE Take 1 tablet (1 mg total) by mouth daily. Start taking on: August 18, 2019   multivitamin  with minerals Tabs tablet Take 1 tablet by mouth daily. Start taking on: August 18, 2019   nicotine 21 mg/24hr patch Commonly known as: NICODERM CQ - dosed in mg/24 hours Place 1 patch (21 mg total) onto the skin daily. Start taking on: August 18, 2019   pantoprazole 40 MG tablet Commonly known as: Protonix Take 1 tablet (40 mg total) by mouth 2 (two) times daily.   sertraline 25 MG tablet Commonly known as: ZOLOFT Take 1 tablet (25  mg total) by mouth daily. Start taking on: August 18, 2019   thiamine 100 MG tablet Take 1 tablet (100 mg total) by mouth daily. Start taking on: August 18, 2019      Follow-up Information    Playasoledo, Boykin Nearingeodoro K, MD Follow up in 2 week(s).   Specialty: Gastroenterology Contact information: [address] [place] [id number] [phone]          No Known Allergies  Consultations:  PCCM  GI   Procedures/Studies: Dg Chest Portable 1 View  Result Date: 08/15/2019 CLINICAL DATA:  Syncopal episode with vomiting blood. EXAM: PORTABLE CHEST 1 VIEW COMPARISON:  08/14/2019 FINDINGS: Lungs are adequately inflated and otherwise clear. Cardiomediastinal silhouette and remainder of the exam is unchanged. IMPRESSION: No active disease. Electronically Signed   By: Elberta Fortisaniel  Boyle M.D.   On: 08/15/2019 06:07   Dg Chest Port 1 View  Result Date: 08/14/2019 CLINICAL DATA:  33 year old female with vomiting EXAM: PORTABLE CHEST 1 VIEW COMPARISON:  None. FINDINGS: The heart size and mediastinal contours are within normal limits. Both lungs are clear. The visualized skeletal structures are unremarkable. IMPRESSION: Negative for acute cardiopulmonary disease Electronically Signed   By: Gilmer MorJaime  Wagner D.O.   On: 08/14/2019 09:19     Subjective:  Pt has generalized weakness, no active rectal bleeding or hematemesis. No CP or abdominal pain.   Discharge Exam: Vitals:   08/17/19 1200 08/17/19 1300  BP: 120/67 115/70  Pulse: 60 60  Resp: 17 14  Temp:    SpO2: 100% 100%   Vitals:   08/17/19 1000 08/17/19 1100 08/17/19 1200 08/17/19 1300  BP: 120/74 111/63 120/67 115/70  Pulse: (!) 53 78 60 60  Resp: 12 (!) 21 17 14   Temp:  (!) 97.2 F (36.2 C)    TempSrc:  Axillary    SpO2: 100% 100% 100% 100%  Weight:      Height:        General: Pt is alert, awake, not in acute distress. Pale looking. Cardiovascular: RRR, S1/S2 +, no rubs, no gallops Respiratory: CTA bilaterally, no  wheezing, no rhonchi Abdominal: Soft, NT, ND, bowel sounds + Extremities: no edema, no cyanosis    The results of significant diagnostics from this hospitalization (including imaging, microbiology, ancillary and laboratory) are listed below for reference.     Microbiology: Recent Results (from the past 240 hour(s))  Urine culture     Status: Abnormal   Collection Time: 08/14/19  9:30 AM   Specimen: Urine, Random  Result Value Ref Range Status   Specimen Description   Final    URINE, RANDOM Performed at [facility], [address] [place], [place] [address]    Special Requests   Final    NONE Performed at [facility], [address] [place], [place] [address]    Culture (A)  Final    <10,000 COLONIES/mL INSIGNIFICANT GROWTH Performed at [facility], [address] [place], [place] [address]    Report  Status 08/16/2019 FINAL  Final  SARS Coronavirus 2 by RT PCR (hospital order, performed in [facility] hospital lab) Nasopharyngeal Nasopharyngeal Swab     Status: None   Collection Time: 08/15/19  4:55 AM   Specimen: Nasopharyngeal Swab  Result Value Ref Range Status   SARS Coronavirus 2 NEGATIVE NEGATIVE Final    Comment: (NOTE) If result is NEGATIVE SARS-CoV-2 target nucleic acids are NOT DETECTED. The SARS-CoV-2 RNA is generally detectable in upper and lower  respiratory specimens during the acute phase of infection. The lowest  concentration of SARS-CoV-2 viral copies this assay can detect is 250  copies / mL. A negative result does not preclude SARS-CoV-2 infection  and should not be used as the sole basis for treatment or other  patient management decisions.  A negative result may occur with  improper specimen collection / handling, submission of specimen other  than nasopharyngeal swab, presence of viral mutation(s) within the  areas targeted by this assay, and inadequate number of viral copies  (<250 copies / mL). A negative result must  be combined with clinical  observations, patient history, and epidemiological information. If result is POSITIVE SARS-CoV-2 target nucleic acids are DETECTED. The SARS-CoV-2 RNA is generally detectable in upper and lower  respiratory specimens dur ing the acute phase of infection.  Positive  results are indicative of active infection with SARS-CoV-2.  Clinical  correlation with patient history and other diagnostic information is  necessary to determine patient infection status.  Positive results do  not rule out bacterial infection or co-infection with other viruses. If result is PRESUMPTIVE POSTIVE SARS-CoV-2 nucleic acids MAY BE PRESENT.   A presumptive positive result was obtained on the submitted specimen  and confirmed on repeat testing.  While 2019 novel coronavirus  (SARS-CoV-2) nucleic acids may be present in the submitted sample  additional confirmatory testing may be necessary for epidemiological  and / or clinical management purposes  to differentiate between  SARS-CoV-2 and other Sarbecovirus currently known to infect humans.  If clinically indicated additional testing with an alternate test  methodology [phone]) is advised. The SARS-CoV-2 RNA is generally  detectable in upper and lower respiratory sp ecimens during the acute  phase of infection. The expected result is Negative. Fact Sheet for Patients:  StrictlyIdeas.no Fact Sheet for Healthcare Providers: BankingDealers.co.za This test is not yet approved or cleared by the [place] FDA and has been authorized for detection and/or diagnosis of SARS-CoV-2 by FDA under an Emergency Use Authorization (EUA).  This EUA will remain in effect (meaning this test can be used) for the duration of the COVID-19 declaration under Section 564(b)(1) of the Act, 21 U.S.C. section 360bbb-3(b)(1), unless the authorization is terminated or revoked sooner. Performed at [facility], [address] [place], [place] [address]   MRSA PCR Screening     Status: None   Collection Time: 08/15/19  9:47 AM   Specimen: Nasal Mucosa; Nasopharyngeal  Result Value Ref Range Status   MRSA by PCR NEGATIVE NEGATIVE Final    Comment:        The GeneXpert MRSA Assay (FDA approved for NASAL specimens only), is one component of a comprehensive MRSA colonization surveillance program. It is not intended to diagnose MRSA infection nor to guide or monitor treatment for MRSA infections. Performed at [facility], [address] [place], [place] [address]   Culture, blood (routine x 2)     Status: None (Preliminary result)   Collection Time: 08/15/19 10:38  AM   Specimen: BLOOD  Result Value Ref Range Status   Specimen Description BLOOD L HAND  Final   Special Requests   Final    BOTTLES DRAWN AEROBIC AND ANAEROBIC Blood Culture adequate volume   Culture   Final    NO GROWTH 2 DAYS Performed at [facility], [address] [place], [place] [address]    Report Status PENDING  Incomplete  Culture, blood (routine x 2)     Status: None (Preliminary result)   Collection Time: 08/15/19 10:53 AM   Specimen: BLOOD  Result Value Ref Range Status   Specimen Description BLOOD BLOOD RIGHT HAND  Final   Special Requests   Final    BOTTLES DRAWN AEROBIC ONLY Blood Culture adequate volume   Culture   Final    NO GROWTH 2 DAYS Performed at [facility], [address], [place], [place] [address]    Report Status PENDING  Incomplete     Labs: BNP (last 3 results) No results for input(s): BNP in the last 8760 hours. Basic Metabolic Panel: Recent Labs  Lab 08/14/19 0813 08/15/19 0418 08/17/19 0506  NA 140 136 140  K 4.7 4.9 3.8  CL 107 110 112*  CO2 21* 19* 24  GLUCOSE 102* 154* 96  BUN 26* 50* 9  CREATININE 0.61 0.66 0.70  CALCIUM 9.2 8.1* 8.1*  MG  --   --  2.1  PHOS  --   --  2.2*   Liver Function Tests: Recent Labs  Lab  08/14/19 0813 08/15/19 0418  AST 21 18  ALT 17 10  ALKPHOS 54 31*  BILITOT 0.7 0.7  PROT 8.1 5.3*  ALBUMIN 4.7 3.1*   Recent Labs  Lab 08/14/19 0813 08/15/19 0418  LIPASE 27 25   Recent Labs  Lab 08/15/19 0509  AMMONIA <9*   CBC: Recent Labs  Lab 08/15/19 1053  08/16/19 0215 08/16/19 0757 08/16/19 1358 08/16/19 2050 08/17/19 0506  WBC 12.6*   < > 11.3* 9.5 8.8 9.2 8.3  NEUTROABS 11.1*  --   --   --   --   --   --   HGB 7.2*   < > 7.4* 7.4* 7.5* 8.0* 7.4*  HCT 21.4*   < > 22.3* 22.1* 22.7* 24.3* 23.1*  MCV 91.5   < > 88.1 88.4 89.4 91.0 92.8  PLT 183   < > 157 150 153 174 174   < > = values in this interval not displayed.   Cardiac Enzymes: No results for input(s): CKTOTAL, CKMB, CKMBINDEX, TROPONINI in the last 168 hours. BNP: Invalid input(s): POCBNP CBG: Recent Labs  Lab 08/15/19 0923  GLUCAP 165*   D-Dimer No results for input(s): DDIMER in the last 72 hours. Hgb A1c No results for input(s): HGBA1C in the last 72 hours. Lipid Profile No results for input(s): CHOL, HDL, LDLCALC, TRIG, CHOLHDL, LDLDIRECT in the last 72 hours. Thyroid function studies No results for input(s): TSH, T4TOTAL, T3FREE, THYROIDAB in the last 72 hours.  Invalid input(s): FREET3 Anemia work up No results for input(s): VITAMINB12, FOLATE, FERRITIN, TIBC, IRON, RETICCTPCT in the last 72 hours. Urinalysis    Component Value Date/Time   COLORURINE YELLOW (A) 08/14/2019 0930   APPEARANCEUR CLEAR (A) 08/14/2019 0930   LABSPEC 1.023 08/14/2019 0930   PHURINE 7.0 08/14/2019 0930   GLUCOSEU NEGATIVE 08/14/2019 0930   HGBUR NEGATIVE 08/14/2019 0930   BILIRUBINUR NEGATIVE 08/14/2019 0930   KETONESUR 80 (A) 08/14/2019 0930  PROTEINUR NEGATIVE 08/14/2019 0930   NITRITE NEGATIVE 08/14/2019 0930   LEUKOCYTESUR NEGATIVE 08/14/2019 0930   Sepsis Labs Invalid input(s): PROCALCITONIN,  WBC,  LACTICIDVEN Microbiology Recent Results (from the past 240 hour(s))  Urine culture      Status: Abnormal   Collection Time: 08/14/19  9:30 AM   Specimen: Urine, Random  Result Value Ref Range Status   Specimen Description   Final    URINE, RANDOM Performed at [facility], [address] [place], [place] [address]    Special Requests   Final    NONE Performed at [facility], [address], [place], [place] [address]    Culture (A)  Final    <10,000 COLONIES/mL INSIGNIFICANT GROWTH Performed at [facility], [address], [place], [place] [address]    Report Status 08/16/2019 FINAL  Final  SARS Coronavirus 2 by RT PCR (hospital order, performed in [facility] hospital lab) Nasopharyngeal Nasopharyngeal Swab     Status: None   Collection Time: 08/15/19  4:55 AM   Specimen: Nasopharyngeal Swab  Result Value Ref Range Status   SARS Coronavirus 2 NEGATIVE NEGATIVE Final    Comment: (NOTE) If result is NEGATIVE SARS-CoV-2 target nucleic acids are NOT DETECTED. The SARS-CoV-2 RNA is generally detectable in upper and lower  respiratory specimens during the acute phase of infection. The lowest  concentration of SARS-CoV-2 viral copies this assay can detect is 250  copies / mL. A negative result does not preclude SARS-CoV-2 infection  and should not be used as the sole basis for treatment or other  patient management decisions.  A negative result may occur with  improper specimen collection / handling, submission of specimen other  than nasopharyngeal swab, presence of viral mutation(s) within the  areas targeted by this assay, and inadequate number of viral copies  (<250 copies / mL). A negative result must be combined with clinical  observations, patient history, and epidemiological information. If result is POSITIVE SARS-CoV-2 target nucleic acids are DETECTED. The SARS-CoV-2 RNA is generally detectable in upper and lower  respiratory specimens dur ing the acute phase of infection.  Positive  results are indicative of active infection  with SARS-CoV-2.  Clinical  correlation with patient history and other diagnostic information is  necessary to determine patient infection status.  Positive results do  not rule out bacterial infection or co-infection with other viruses. If result is PRESUMPTIVE POSTIVE SARS-CoV-2 nucleic acids MAY BE PRESENT.   A presumptive positive result was obtained on the submitted specimen  and confirmed on repeat testing.  While 2019 novel coronavirus  (SARS-CoV-2) nucleic acids may be present in the submitted sample  additional confirmatory testing may be necessary for epidemiological  and / or clinical management purposes  to differentiate between  SARS-CoV-2 and other Sarbecovirus currently known to infect humans.  If clinically indicated additional testing with an alternate test  methodology [phone]) is advised. The SARS-CoV-2 RNA is generally  detectable in upper and lower respiratory sp ecimens during the acute  phase of infection. The expected result is Negative. Fact Sheet for Patients:  BoilerBrush.com.cy Fact Sheet for Healthcare Providers: https://pope.com/ This test is not yet approved or cleared by the [place] FDA and has been authorized for detection and/or diagnosis of SARS-CoV-2 by FDA under an Emergency Use Authorization (EUA).  This EUA will remain in effect (meaning this test can be used) for the duration of the COVID-19 declaration under Section 564(b)(1) of the Act, 21 U.S.C.  section 360bbb-3(b)(1), unless the authorization is terminated or revoked sooner. Performed at [facility], [address] [place], [place] [address]   MRSA PCR Screening     Status: None   Collection Time: 08/15/19  9:47 AM   Specimen: Nasal Mucosa; Nasopharyngeal  Result Value Ref Range Status   MRSA by PCR NEGATIVE NEGATIVE Final    Comment:        The GeneXpert MRSA Assay (FDA approved for NASAL specimens only), is one  component of a comprehensive MRSA colonization surveillance program. It is not intended to diagnose MRSA infection nor to guide or monitor treatment for MRSA infections. Performed at [facility], [address] [place], [place] [address]   Culture, blood (routine x 2)     Status: None (Preliminary result)   Collection Time: 08/15/19 10:38 AM   Specimen: BLOOD  Result Value Ref Range Status   Specimen Description BLOOD L HAND  Final   Special Requests   Final    BOTTLES DRAWN AEROBIC AND ANAEROBIC Blood Culture adequate volume   Culture   Final    NO GROWTH 2 DAYS Performed at [facility], [address] [place], [place] [address]    Report Status PENDING  Incomplete  Culture, blood (routine x 2)     Status: None (Preliminary result)   Collection Time: 08/15/19 10:53 AM   Specimen: BLOOD  Result Value Ref Range Status   Specimen Description BLOOD BLOOD RIGHT HAND  Final   Special Requests   Final    BOTTLES DRAWN AEROBIC ONLY Blood Culture adequate volume   Culture   Final    NO GROWTH 2 DAYS Performed at [facility], [address] [place], [place] [address]    Report Status PENDING  Incomplete    Time coordinating discharge:  35  minutes  SIGNED:  Lorretta Harp, DO [facility] 08/17/2019, 1:35 PM Pager is on AMION  If 7PM-7AM, please contact night-coverage [email or web address] Password [email or web address]

## 2019-08-20 LAB — CULTURE, BLOOD (ROUTINE X 2)
Culture: NO GROWTH
Culture: NO GROWTH
Special Requests: ADEQUATE
Special Requests: ADEQUATE

## 2019-10-28 LAB — HM PAP SMEAR: HM Pap smear: NEGATIVE

## 2019-11-17 ENCOUNTER — Other Ambulatory Visit: Payer: Self-pay | Admitting: Obstetrics and Gynecology

## 2019-11-17 DIAGNOSIS — N63 Unspecified lump in unspecified breast: Secondary | ICD-10-CM

## 2019-11-23 ENCOUNTER — Ambulatory Visit
Admission: RE | Admit: 2019-11-23 | Discharge: 2019-11-23 | Disposition: A | Discharge status: Home / Self Care | Payer: BLUE CROSS/BLUE SHIELD | Source: Ambulatory Visit | Attending: Obstetrics and Gynecology | Admitting: Obstetrics and Gynecology

## 2019-11-23 DIAGNOSIS — N63 Unspecified lump in unspecified breast: Secondary | ICD-10-CM | POA: Insufficient documentation

## 2020-06-13 DIAGNOSIS — O09891 Supervision of other high risk pregnancies, first trimester: Secondary | ICD-10-CM | POA: Diagnosis not present

## 2020-06-13 DIAGNOSIS — N912 Amenorrhea, unspecified: Secondary | ICD-10-CM | POA: Diagnosis not present

## 2020-06-26 DIAGNOSIS — F419 Anxiety disorder, unspecified: Secondary | ICD-10-CM | POA: Insufficient documentation

## 2020-06-26 DIAGNOSIS — O99340 Other mental disorders complicating pregnancy, unspecified trimester: Secondary | ICD-10-CM | POA: Insufficient documentation

## 2020-07-03 DIAGNOSIS — O0993 Supervision of high risk pregnancy, unspecified, third trimester: Secondary | ICD-10-CM | POA: Insufficient documentation

## 2020-07-13 DIAGNOSIS — O99340 Other mental disorders complicating pregnancy, unspecified trimester: Secondary | ICD-10-CM | POA: Diagnosis not present

## 2020-07-13 DIAGNOSIS — F32A Depression, unspecified: Secondary | ICD-10-CM | POA: Diagnosis not present

## 2020-07-13 DIAGNOSIS — F419 Anxiety disorder, unspecified: Secondary | ICD-10-CM | POA: Diagnosis not present

## 2020-07-13 DIAGNOSIS — O359XX1 Maternal care for (suspected) fetal abnormality and damage, unspecified, fetus 1: Secondary | ICD-10-CM | POA: Diagnosis not present

## 2020-07-13 LAB — OB RESULTS CONSOLE RUBELLA ANTIBODY, IGM: Rubella: IMMUNE

## 2020-07-13 LAB — OB RESULTS CONSOLE VARICELLA ZOSTER ANTIBODY, IGG: Varicella: IMMUNE

## 2020-10-07 NOTE — L&D Delivery Note (Signed)
Delivery Note  Gail Mullins is a G1P0 at [redacted]w[redacted]d with an LMP of 04/13/2020, consistent with [place] at [redacted]w[redacted]d.   First Stage: Labor onset: 1800 Augmentation: AROM Analgesia /Anesthesia intrapartum: Epidural  AROM at 0623  Second Stage: Complete dilation at 0623 Onset of pushing at 0643 FHR second stage Initially 160bpm with moderate variability, recurrent lates and variables.  IV fluid bolus given and FSE applied to better monitor FHR.  Side-lying positioning utilized for pushing.  Baseline changed to previous baseline of 140bpm, moderate variability, and variables with pushing. Terminal deceleration immediate before delivery.   Gail Mullins presented to L&D in active labor and advanced dilation.  She received an epidural and progressed quickly to C/C/0 with a bulging bag.  AROM was performed for placement of an FSE prior to 2nd dose of antibiotics d/t NRFHR and unable to accurately monitor FHR with an external [place].  FHR resolved with conservative measures and presenting part descended from 0 station to +1 station.  Gail Mullins had a spontaneous urge to push and pushed well over approximately 45 minutes.   Delivery of a viable baby boy on 01/08/2021 at 0728 by CNM Delivery of fetal head in OA position with restitution to LOT. Loose nuchal cord x 1;  Anterior then posterior shoulders delivered easily with gentle downward traction. Infant easily delivered through nuchal cord. Baby placed on mom's chest, and attended to by baby RN. Cord double clamped after cessation of pulsation, cut by father of baby   Cord blood sample collected: N/A, A pos    Third Stage: Oxytocin bolus started after delivery of infant for hemorrhage prophylaxis  Placenta delivered intact with 3 VC @ 0734 Placenta disposition: discarded Uterine tone firm / bleeding moderate   1st degree vaginal that extended into right labial laceration along with left hymenal laceration identified  Anesthesia for repair: Epidural and 1% Lidocaine  for local injection Repair of vaginal and labial extension with 2-0 Vicryl CT in usual fashion.  Left hymenal laceration was approximated with 2-0 Vicryl CT.   Est. Blood Loss (mL): 200 ml   Complications: none  Mom to postpartum.  Baby to Couplet care / Skin to Skin.  Newborn: Information for the patient's newborn:  Kelaiah, [place]alona [[id number]]  Live born female  Birth Weight: 7 lb 3 oz (3260 g) APGAR: 6, 8  Newborn Delivery   Birth date/time: 01/08/2021 07:28:00 Delivery type: Vaginal, Spontaneous     Feeding planned: Breast   ---------- Margaretmary Eddy, CNM Certified Nurse Midwife [facility]

## 2020-12-21 LAB — OB RESULTS CONSOLE GC/CHLAMYDIA
Chlamydia: NEGATIVE
Gonorrhea: NEGATIVE

## 2020-12-21 LAB — OB RESULTS CONSOLE HIV ANTIBODY (ROUTINE TESTING): HIV: NONREACTIVE

## 2020-12-21 LAB — OB RESULTS CONSOLE RPR: RPR: NONREACTIVE

## 2020-12-21 LAB — OB RESULTS CONSOLE GBS: GBS: POSITIVE

## 2021-01-06 ENCOUNTER — Other Ambulatory Visit: Payer: Self-pay

## 2021-01-06 DIAGNOSIS — Z3490 Encounter for supervision of normal pregnancy, unspecified, unspecified trimester: Secondary | ICD-10-CM

## 2021-01-06 DIAGNOSIS — O35EXX0 Maternal care for other (suspected) fetal abnormality and damage, fetal genitourinary anomalies, not applicable or unspecified: Secondary | ICD-10-CM

## 2021-01-06 DIAGNOSIS — O358XX0 Maternal care for other (suspected) fetal abnormality and damage, not applicable or unspecified: Secondary | ICD-10-CM

## 2021-01-06 DIAGNOSIS — IMO0002 Reserved for concepts with insufficient information to code with codable children: Secondary | ICD-10-CM | POA: Insufficient documentation

## 2021-01-06 NOTE — Progress Notes (Signed)
G1P0 at [redacted]w[redacted]d, LMP of 04/13/2020, c/w early [place] at [redacted]w[redacted]d.  Scheduled for induction of labor for Fetal unilateral multicystic dysplastic kidney disease on 01/14/2021 at 0500.   Prenatal provider: [facility] Pregnancy complicated by: 1. Fetal unilateral multicystic dysplastic kidney disease a. Pediatric urology consult done 10/13/2020 - see note  b. Does not need inpatient testing after birth  2. Tobacco use in pregnancy  3. Maternal mental health diagnoses  a. Depression and anxiety b. Currently taking Vraylar, transitioning from zoloft to prozac  4. GBS positive   Prenatal Labs: Blood type/Rh A Pos  Antibody screen neg  Rubella Immune  Varicella Immune  RPR NR  HBsAg Neg  HIV NR  GC neg  Chlamydia neg  Genetic screening Not done   1 hour GTT 112  3 hour GTT N/A   GBS Positive    Tdap: given 11/02/2020 Flu: given 11/02/2020 Contraception: TBD Feeding preference: TBD  ____ Gail Mullins, CNM Certified Nurse Midwife [place]  [facility]

## 2021-01-08 ENCOUNTER — Inpatient Hospital Stay: Payer: BLUE CROSS/BLUE SHIELD | Admitting: Anesthesiology

## 2021-01-08 ENCOUNTER — Inpatient Hospital Stay
Admission: EM | Admit: 2021-01-08 | Discharge: 2021-01-09 | DRG: 807 | Disposition: A | Discharge status: Home / Self Care | Payer: BLUE CROSS/BLUE SHIELD | Attending: Obstetrics and Gynecology | Admitting: Obstetrics and Gynecology

## 2021-01-08 ENCOUNTER — Other Ambulatory Visit: Payer: Self-pay

## 2021-01-08 DIAGNOSIS — O358XX0 Maternal care for other (suspected) fetal abnormality and damage, not applicable or unspecified: Secondary | ICD-10-CM | POA: Diagnosis present

## 2021-01-08 DIAGNOSIS — O99824 Streptococcus B carrier state complicating childbirth: Secondary | ICD-10-CM | POA: Diagnosis present

## 2021-01-08 DIAGNOSIS — F17210 Nicotine dependence, cigarettes, uncomplicated: Secondary | ICD-10-CM | POA: Diagnosis present

## 2021-01-08 DIAGNOSIS — O99334 Smoking (tobacco) complicating childbirth: Secondary | ICD-10-CM | POA: Diagnosis present

## 2021-01-08 DIAGNOSIS — F32A Depression, unspecified: Secondary | ICD-10-CM | POA: Diagnosis present

## 2021-01-08 DIAGNOSIS — F419 Anxiety disorder, unspecified: Secondary | ICD-10-CM | POA: Diagnosis present

## 2021-01-08 DIAGNOSIS — O99344 Other mental disorders complicating childbirth: Secondary | ICD-10-CM | POA: Diagnosis present

## 2021-01-08 DIAGNOSIS — Z3A38 38 weeks gestation of pregnancy: Secondary | ICD-10-CM | POA: Diagnosis not present

## 2021-01-08 DIAGNOSIS — O26893 Other specified pregnancy related conditions, third trimester: Secondary | ICD-10-CM | POA: Diagnosis present

## 2021-01-08 DIAGNOSIS — Z20822 Contact with and (suspected) exposure to covid-19: Secondary | ICD-10-CM | POA: Diagnosis present

## 2021-01-08 HISTORY — DX: Depression, unspecified: F32.A

## 2021-01-08 HISTORY — DX: Anxiety disorder, unspecified: F41.9

## 2021-01-08 LAB — TYPE AND SCREEN
ABO/RH(D): A POS
Antibody Screen: NEGATIVE

## 2021-01-08 LAB — CBC
HCT: 38.2 % (ref 36.0–46.0)
Hemoglobin: 12.8 g/dL (ref 12.0–15.0)
MCH: 29.8 pg (ref 26.0–34.0)
MCHC: 33.5 g/dL (ref 30.0–36.0)
MCV: 89 fL (ref 80.0–100.0)
Platelets: 345 10*3/uL (ref 150–400)
RBC: 4.29 MIL/uL (ref 3.87–5.11)
RDW: 12.7 % (ref 11.5–15.5)
WBC: 18.3 10*3/uL — ABNORMAL HIGH (ref 4.0–10.5)
nRBC: 0 % (ref 0.0–0.2)

## 2021-01-08 LAB — RESP PANEL BY RT-PCR (FLU A&B, COVID) ARPGX2
Influenza A by PCR: NEGATIVE
Influenza B by PCR: NEGATIVE
SARS Coronavirus 2 by RT PCR: NEGATIVE

## 2021-01-08 LAB — RPR: RPR Ser Ql: NONREACTIVE

## 2021-01-08 MED ORDER — PENICILLIN G POT IN DEXTROSE 60000 UNIT/ML IV SOLN: 3.0000 10*6.[IU] | INTRAVENOUS | Status: DC

## 2021-01-08 MED ORDER — MISOPROSTOL 200 MCG PO TABS
ORAL_TABLET | ORAL | Status: AC
  Filled 2021-01-08: qty 4

## 2021-01-08 MED ORDER — PHENYLEPHRINE 40 MCG/ML (10ML) SYRINGE FOR IV PUSH (FOR BLOOD PRESSURE SUPPORT): 80.0000 ug | PREFILLED_SYRINGE | INTRAVENOUS | Status: DC | PRN

## 2021-01-08 MED ORDER — FERROUS SULFATE 325 (65 FE) MG PO TABS
325.0000 mg | ORAL_TABLET | Freq: Two times a day (BID) | ORAL | Status: DC
  Administered 2021-01-08 – 2021-01-09 (×3): 325 mg via ORAL
  Filled 2021-01-08 (×3): qty 1

## 2021-01-08 MED ORDER — WITCH HAZEL-GLYCERIN EX PADS
1.0000 "application " | MEDICATED_PAD | CUTANEOUS | Status: DC | PRN
  Administered 2021-01-08: 1 via TOPICAL
  Filled 2021-01-08: qty 100

## 2021-01-08 MED ORDER — ACETAMINOPHEN 500 MG PO TABS: 1000.0000 mg | ORAL_TABLET | Freq: Four times a day (QID) | ORAL | Status: DC | PRN

## 2021-01-08 MED ORDER — PRENATAL MULTIVITAMIN CH: 1.0000 | ORAL_TABLET | Freq: Every day | ORAL | Status: DC

## 2021-01-08 MED ORDER — LIDOCAINE HCL (PF) 1 % IJ SOLN
INTRAMUSCULAR | Status: DC | PRN
  Administered 2021-01-08: 3 mL

## 2021-01-08 MED ORDER — EPHEDRINE 5 MG/ML INJ: 10.0000 mg | INTRAVENOUS | Status: DC | PRN

## 2021-01-08 MED ORDER — SODIUM CHLORIDE 0.9 % IV SOLN
INTRAVENOUS | Status: AC
  Filled 2021-01-08: qty 5

## 2021-01-08 MED ORDER — LACTATED RINGERS IV SOLN: 500.0000 mL | INTRAVENOUS | Status: DC | PRN

## 2021-01-08 MED ORDER — CALCIUM CARBONATE ANTACID 500 MG PO CHEW: 400.0000 mg | CHEWABLE_TABLET | Freq: Three times a day (TID) | ORAL | Status: DC | PRN

## 2021-01-08 MED ORDER — IBUPROFEN 600 MG PO TABS: 600.0000 mg | ORAL_TABLET | Freq: Four times a day (QID) | ORAL | Status: DC

## 2021-01-08 MED ORDER — MISOPROSTOL 25 MCG QUARTER TABLET: 25.0000 ug | ORAL_TABLET | ORAL | Status: DC | PRN

## 2021-01-08 MED ORDER — TETANUS-DIPHTH-ACELL PERTUSSIS 5-2.5-18.5 LF-MCG/0.5 IM SUSY
0.5000 mL | PREFILLED_SYRINGE | Freq: Once | INTRAMUSCULAR | Status: DC
  Filled 2021-01-08: qty 0.5

## 2021-01-08 MED ORDER — FENTANYL 2.5 MCG/ML W/ROPIVACAINE 0.15% IN NS 100 ML EPIDURAL (ARMC)
EPIDURAL | Status: AC
  Filled 2021-01-08: qty 100

## 2021-01-08 MED ORDER — BUPIVACAINE HCL (PF) 0.25 % IJ SOLN
INTRAMUSCULAR | Status: DC | PRN
  Administered 2021-01-08: 8 mL via EPIDURAL

## 2021-01-08 MED ORDER — DIBUCAINE (PERIANAL) 1 % EX OINT: 1.0000 "application " | TOPICAL_OINTMENT | CUTANEOUS | Status: DC | PRN

## 2021-01-08 MED ORDER — OXYCODONE HCL 5 MG PO TABS: 5.0000 mg | ORAL_TABLET | ORAL | Status: DC | PRN

## 2021-01-08 MED ORDER — COCONUT OIL OIL
1.0000 "application " | TOPICAL_OIL | Status: DC | PRN
  Administered 2021-01-08: 1 via TOPICAL
  Filled 2021-01-08: qty 120

## 2021-01-08 MED ORDER — IBUPROFEN 600 MG PO TABS
600.0000 mg | ORAL_TABLET | Freq: Four times a day (QID) | ORAL | Status: DC
  Administered 2021-01-08 – 2021-01-09 (×4): 600 mg via ORAL
  Filled 2021-01-08 (×4): qty 1

## 2021-01-08 MED ORDER — SOD CITRATE-CITRIC ACID 500-334 MG/5ML PO SOLN: 30.0000 mL | ORAL | Status: DC | PRN

## 2021-01-08 MED ORDER — ONDANSETRON HCL 4 MG/2ML IJ SOLN: 4.0000 mg | INTRAMUSCULAR | Status: DC | PRN

## 2021-01-08 MED ORDER — ACETAMINOPHEN 325 MG PO TABS
650.0000 mg | ORAL_TABLET | ORAL | Status: DC | PRN
  Administered 2021-01-08: 650 mg via ORAL
  Filled 2021-01-08: qty 2

## 2021-01-08 MED ORDER — SIMETHICONE 80 MG PO CHEW: 80.0000 mg | CHEWABLE_TABLET | ORAL | Status: DC | PRN

## 2021-01-08 MED ORDER — LACTATED RINGERS IV SOLN: 500.0000 mL | Freq: Once | INTRAVENOUS | Status: DC

## 2021-01-08 MED ORDER — LACTATED RINGERS IV SOLN: INTRAVENOUS | Status: DC

## 2021-01-08 MED ORDER — OXYTOCIN 10 UNIT/ML IJ SOLN
INTRAMUSCULAR | Status: AC
  Filled 2021-01-08: qty 2

## 2021-01-08 MED ORDER — AMMONIA AROMATIC IN INHA
RESPIRATORY_TRACT | Status: AC
  Filled 2021-01-08: qty 10

## 2021-01-08 MED ORDER — BUTORPHANOL TARTRATE 1 MG/ML IJ SOLN: 1.0000 mg | INTRAMUSCULAR | Status: DC | PRN

## 2021-01-08 MED ORDER — FENTANYL 2.5 MCG/ML W/ROPIVACAINE 0.15% IN NS 100 ML EPIDURAL (ARMC)
12.0000 mL/h | EPIDURAL | Status: DC
  Administered 2021-01-08: 12 mL/h via EPIDURAL

## 2021-01-08 MED ORDER — LIDOCAINE-EPINEPHRINE (PF) 1.5 %-1:200000 IJ SOLN
INTRAMUSCULAR | Status: DC | PRN
  Administered 2021-01-08: 3 mL via PERINEURAL

## 2021-01-08 MED ORDER — SODIUM CHLORIDE 0.9 % IV SOLN
5.0000 10*6.[IU] | Freq: Once | INTRAVENOUS | Status: AC
  Administered 2021-01-08: 5 10*6.[IU] via INTRAVENOUS

## 2021-01-08 MED ORDER — OXYTOCIN-SODIUM CHLORIDE 30-0.9 UT/500ML-% IV SOLN
2.5000 [IU]/h | INTRAVENOUS | Status: DC
  Administered 2021-01-08: 2.5 [IU]/h via INTRAVENOUS
  Filled 2021-01-08: qty 500

## 2021-01-08 MED ORDER — LIDOCAINE HCL (PF) 1 % IJ SOLN: 30.0000 mL | INTRAMUSCULAR | Status: AC | PRN

## 2021-01-08 MED ORDER — OXYCODONE HCL 5 MG PO TABS: 10.0000 mg | ORAL_TABLET | ORAL | Status: DC | PRN

## 2021-01-08 MED ORDER — BENZOCAINE-MENTHOL 20-0.5 % EX AERO
1.0000 "application " | INHALATION_SPRAY | CUTANEOUS | Status: DC | PRN
  Administered 2021-01-08: 1 via TOPICAL
  Filled 2021-01-08: qty 56

## 2021-01-08 MED ORDER — ONDANSETRON HCL 4 MG/2ML IJ SOLN
4.0000 mg | Freq: Four times a day (QID) | INTRAMUSCULAR | Status: DC | PRN
  Administered 2021-01-08: 4 mg via INTRAVENOUS
  Filled 2021-01-08: qty 2

## 2021-01-08 MED ORDER — IBUPROFEN 600 MG PO TABS
600.0000 mg | ORAL_TABLET | Freq: Four times a day (QID) | ORAL | Status: DC
  Administered 2021-01-08 (×2): 600 mg via ORAL
  Filled 2021-01-08 (×2): qty 1

## 2021-01-08 MED ORDER — ONDANSETRON HCL 4 MG PO TABS
4.0000 mg | ORAL_TABLET | ORAL | Status: DC | PRN
  Filled 2021-01-08: qty 1

## 2021-01-08 MED ORDER — DIPHENHYDRAMINE HCL 25 MG PO CAPS: 25.0000 mg | ORAL_CAPSULE | Freq: Four times a day (QID) | ORAL | Status: DC | PRN

## 2021-01-08 MED ORDER — DOCUSATE SODIUM 100 MG PO CAPS
100.0000 mg | ORAL_CAPSULE | Freq: Two times a day (BID) | ORAL | Status: DC
  Administered 2021-01-09 (×2): 100 mg via ORAL
  Filled 2021-01-08 (×2): qty 1

## 2021-01-08 MED ORDER — DIPHENHYDRAMINE HCL 50 MG/ML IJ SOLN: 12.5000 mg | INTRAMUSCULAR | Status: DC | PRN

## 2021-01-08 MED ORDER — OXYTOCIN BOLUS FROM INFUSION
333.0000 mL | Freq: Once | INTRAVENOUS | Status: AC
  Administered 2021-01-08: 333 mL via INTRAVENOUS

## 2021-01-08 MED ORDER — LIDOCAINE HCL (PF) 1 % IJ SOLN
INTRAMUSCULAR | Status: AC
  Administered 2021-01-08: 30 mL via SUBCUTANEOUS
  Filled 2021-01-08: qty 30

## 2021-01-08 NOTE — Anesthesia Procedure Notes (Signed)
Epidural Patient location during procedure: OB Start time: 01/08/2021 4:54 AM End time: 01/08/2021 5:05 AM  Staffing Anesthesiologist: Yves Dill, MD Performed: anesthesiologist   Preanesthetic Checklist Completed: patient identified, IV checked, site marked, risks and benefits discussed, surgical consent, monitors and equipment checked, pre-op evaluation and timeout performed  Epidural Patient position: sitting Prep: Betadine Patient monitoring: heart rate, continuous pulse ox and blood pressure Approach: midline Location: L3-L4 Injection technique: LOR air  Needle:  Needle type: Tuohy  Needle gauge: 17 G Needle length: 9 cm and 9 Catheter type: closed end flexible Catheter size: 19 Gauge Test dose: negative and 1.5% lidocaine with Epi 1:200 K  Assessment Events: blood not aspirated, injection not painful, no injection resistance, no paresthesia and negative IV test  Additional Notes Time out called.  Patient placed in sitting position.  Back prepped and draped in sterile fashion. A skin wheal was made in the L3-L4 interspace with 1% Lidocaine plain.  A 17G Tuohy needle was advanced into the epidural space by a loss of resistance technique.  The epidural catheter was threaded 3 cm and the TD was negative.  No blood, fluid or paresthesias.  The patient tolerated the procedure well and the catheter was affixed to the back in sterile fashion.Reason for block:procedure for pain

## 2021-01-08 NOTE — Anesthesia Preprocedure Evaluation (Signed)
Anesthesia Evaluation  Patient identified by MRN, date of birth, ID band Patient awake    Reviewed: Allergy & Precautions, NPO status , Patient's Chart, lab work & pertinent test results  Airway Mallampati: II  TM Distance: >3 FB     Dental  (+) Teeth Intact   Pulmonary Current Smoker,    Pulmonary exam normal        Cardiovascular negative cardio ROS Normal cardiovascular exam     Neuro/Psych PSYCHIATRIC DISORDERS Anxiety Depression negative neurological ROS     GI/Hepatic GERD  ,(+)     substance abuse  alcohol use,   Endo/Other  negative endocrine ROS  Renal/GU Renal disease  negative genitourinary   Musculoskeletal negative musculoskeletal ROS (+)   Abdominal Normal abdominal exam  (+)   Peds negative pediatric ROS (+)  Hematology  (+) anemia ,   Anesthesia Other Findings   Reproductive/Obstetrics (+) Pregnancy                             Anesthesia Physical Anesthesia Plan  ASA: II  Anesthesia Plan: Epidural   Post-op Pain Management:    Induction: Intravenous  PONV Risk Score and Plan:   Airway Management Planned: Natural Airway  Additional Equipment:   Intra-op Plan:   Post-operative Plan:   Informed Consent: I have reviewed the patients History and Physical, chart, labs and discussed the procedure including the risks, benefits and alternatives for the proposed anesthesia with the patient or authorized representative who has indicated his/her understanding and acceptance.     Dental advisory given  Plan Discussed with: CRNA and Surgeon  Anesthesia Plan Comments:         Anesthesia Quick Evaluation

## 2021-01-08 NOTE — OB Triage Note (Signed)
Pt Gail Mullins 35 y.o. presents to the ED complaining of contractions as well as nausea and vomiting. Pt is a G1P0 at [redacted]w[redacted]d. Pt denies signs and symptons consistent with rupture of membranes or active vaginal bleeding. Pt states that she is having contractions and states positive fetal movement. External FM and TOCO applied to non-tender abdomen and assessing. Initial FHR 140. Vital signs obtained and within normal limits. Provider notified of pt.

## 2021-01-08 NOTE — Lactation Note (Signed)
This note was copied from a baby's chart. Lactation Consultation Note  Patient Name: Gail Mullins [id number] Date: 01/08/2021 Reason for consult: Initial assessment;1st time breastfeeding;Early term 37-38.6wks Age:35 hours  LC Student arrived in the room where mother, father , and infant were present. Mother stated this was her first time breastfeeding. She stated that the infant had a good latch in L&D but it was so much going on that she did not really get to latch him herself and she wanted to try. LC Student educated her about breast changes, engorgement, colostrum and mature milk, feeding cues and patterns, paced bottle feeding, and feeding positions. Mother states she plans to exclusively breastfeed for as long as possible. [facility] Student assisted mother with getting her and infant in position to attempt to feed. [facility] Student taught mother how to hand express and colostrum was easily expressed. Infant was not interested in latching even with stimulation. [facility] Student informed mother that when infant begins to cue she should try again and if assistance is needed it will be provided. Parents had not further questions at this time.  Maternal Data Has patient been taught Hand Expression?: Yes Does the patient have breastfeeding experience prior to this delivery?: No  Feeding Mother's Current Feeding Choice: Breast Milk  LATCH Score Latch: Too sleepy or reluctant, no latch achieved, no sucking elicited.  Audible Swallowing: None  Type of Nipple: Everted at rest and after stimulation  Comfort (Breast/Nipple): Soft / non-tender    Interventions Interventions: Breast feeding basics reviewed;Assisted with latch;Adjust position;Support pillows;Skin to skin;Breast massage;Hand express;Education    Consult Status Consult Status: Follow-up Date: 01/09/21    Gail Mullins 01/08/2021, 12:21 PM

## 2021-01-08 NOTE — H&P (Signed)
OB History & Physical   History of Present Illness:   Chief Complaint: regular contractions   HPI:  Gail Mullins is a 35 y.o. G1P0 female at [redacted]w[redacted]d dated by LMP of 04/13/2020, c/w early [place] at [redacted]w[redacted]d.  She presents to L&D for regular, painful contractions that have gotten progressively worse since yesterday evening.  Reports active fetal movement  Contractions: every 2 to 3 minutes, started at 1800 LOF/SROM: Denies  Vaginal bleeding: Denies   Pregnancy Issues: 1. Fetal unilateral multicystic dysplastic kidney disease a. Pediatric urology consult done 10/13/2020 - see note  b. Does not need inpatient testing after birth  2. Tobacco use in pregnancy  3. Maternal mental health diagnoses  a. Depression and anxiety b. Currently taking Vraylar, transitioning from zoloft to prozac  4. GBS positive    Patient Active Problem List   Diagnosis Date Noted  . Indication for care in labor or delivery 01/08/2021  . Fetal polycystic kidney diagnosed prenatally 01/06/2021  . Supervision of high risk pregnancy in third trimester 07/03/2020  . Anxiety in pregnancy, antepartum 06/26/2020  . Anxiety 08/17/2019  . Alcohol abuse   . Acute blood loss anemia   . GERD (gastroesophageal reflux disease)   . Tobacco abuse   . Acute gastrointestinal hemorrhage 08/15/2019     Maternal Medical History:   Past Medical History:  Diagnosis Date  . Alcohol abuse   . Anxiety   . Depression     Past Surgical History:  Procedure Laterality Date  . ESOPHAGOGASTRODUODENOSCOPY N/A 08/15/2019   Procedure: ESOPHAGOGASTRODUODENOSCOPY (EGD);  Surgeon: [place], Boykin Nearing, MD;  Location: [facility] ENDOSCOPY;  Service: Gastroenterology;  Laterality: N/A;    No Known Allergies  Prior to Admission medications   Medication Sig Start Date End Date Taking? Authorizing Provider  FLUoxetine (PROZAC) 40 MG capsule Take 40 mg by mouth daily.   Yes [provider]  nicotine (NICODERM CQ - DOSED IN MG/24 HOURS) 21  mg/24hr patch Place 1 patch (21 mg total) onto the skin daily. 08/18/19  Yes Lorretta Harp, MD  prenatal vitamin w/FE, FA (PRENATAL 1 + 1) 27-1 MG TABS tablet Take 1 tablet by mouth daily at 12 noon.   Yes [provider]  VRAYLAR capsule Take 1.5 mg by mouth daily. 12/21/20  Yes [provider]  ferrous sulfate 325 (65 FE) MG tablet Take 1 tablet (325 mg total) by mouth daily with breakfast. Patient not taking: Reported on 01/08/2021 08/17/19   Lorretta Harp, MD  folic acid (FOLVITE) 1 MG tablet Take 1 tablet (1 mg total) by mouth daily. Patient not taking: Reported on 01/08/2021 08/18/19   Lorretta Harp, MD  Multiple Vitamin (MULTIVITAMIN WITH MINERALS) TABS tablet Take 1 tablet by mouth daily. Patient not taking: Reported on 01/08/2021 08/18/19   Lorretta Harp, MD  pantoprazole (PROTONIX) 40 MG tablet Take 1 tablet (40 mg total) by mouth 2 (two) times daily. 08/17/19 08/16/20  Lorretta Harp, MD  sertraline (ZOLOFT) 25 MG tablet Take 1 tablet (25 mg total) by mouth daily. Patient not taking: Reported on 01/08/2021 08/18/19   Lorretta Harp, MD  thiamine 100 MG tablet Take 1 tablet (100 mg total) by mouth daily. Patient not taking: Reported on 01/08/2021 08/18/19   Lorretta Harp, MD     Prenatal care site:  [facility]  Social History: She  reports that she has been smoking. She has a 7.50 pack-year smoking history. She has never used smokeless tobacco. She reports previous alcohol use. She reports previous  drug use.  Family History: family history is not on file.   Review of Systems: A full review of systems was performed and negative except as noted in the HPI.     Physical Exam:  Vital Signs: BP 132/78 (BP Location: Left Arm)   Pulse 89   Temp 98 F (36.7 C) (Oral)   Resp 16   Ht 5\' 7"  (1.702 m)   Wt 96.6 kg   BMI 33.36 kg/m  Physical Exam  General: no acute distress.  HEENT: normocephalic, atraumatic Heart: regular rate & rhythm.  No murmurs/rubs/gallops Lungs: clear to  auscultation bilaterally, normal respiratory effort Abdomen: soft, gravid, non-tender;  EFW: 7lbs  Pelvic:   External: Normal external female genitalia  Cervix: Dilation: 8.5 / Effacement (%): 90 / Station: 0    Extremities: non-tender, symmetric, No edema bilaterally.  DTRs: 2+/2+  Neurologic: Alert & oriented x 3.    Results for orders placed or performed during the hospital encounter of 01/08/21 (from the past 24 hour(s))  Type and screen     Status: None   Collection Time: 01/08/21  2:47 AM  Result Value Ref Range   ABO/RH(D) A POS    Antibody Screen NEG    Sample Expiration      01/11/2021,2359 Performed at [facility], [address] [place], [place]   CBC     Status: Abnormal   Collection Time: 01/08/21  2:58 AM  Result Value Ref Range   WBC 18.3 (H) 4.0 - 10.5 K/uL   RBC 4.29 3.87 - 5.11 MIL/uL   Hemoglobin 12.8 12.0 - 15.0 g/dL   HCT 03/10/21 96.2 - 22.9 %   MCV 89.0 80.0 - 100.0 fL   MCH 29.8 26.0 - 34.0 pg   MCHC 33.5 30.0 - 36.0 g/dL   RDW 79.8 92.1 - 19.4 %   Platelets 345 150 - 400 K/uL   nRBC 0.0 0.0 - 0.2 %  Resp Panel by RT-PCR (Flu A&B, Covid) Nasopharyngeal Swab     Status: None   Collection Time: 01/08/21  2:58 AM   Specimen: Nasopharyngeal Swab; Nasopharyngeal(NP) swabs in vial transport medium  Result Value Ref Range   SARS Coronavirus 2 by RT PCR NEGATIVE NEGATIVE   Influenza A by PCR NEGATIVE NEGATIVE   Influenza B by PCR NEGATIVE NEGATIVE    Pertinent Results:  Prenatal Labs: Blood type/Rh A Pos  Antibody screen neg  Rubella Immune  Varicella Immune  RPR NR  HBsAg Neg  HIV NR  GC neg  Chlamydia neg  Genetic screening Not done   1 hour GTT 112  3 hour GTT N/A   GBS Positive     FHT: Baseline: 145 bpm, Variability: moderate, Accelerations: present and Decelerations: Absent TOCO: regular, every 2-3 minutes SVE:  Dilation: 8.5 / Effacement (%): 90 / Station: 0, bulging bag   Cephalic by [place] and SVE   No  results found.  Assessment:  Gail Mullins is a 35 y.o. G1P0 female at [redacted]w[redacted]d with active labor.   Plan:  1. Admit to Labor & Delivery; consents reviewed and obtained - Covid admission screen   2. Fetal Well being  - Fetal Tracing: cat 1 - Group B Streptococcus ppx indicated: GBS pos - Presentation: cephalic confirmed by SVE   3. Routine OB: - Prenatal labs reviewed, as above - Rh Pos - CBC, T&S, RPR on admit - Clear fluids, IVF  4. Monitoring of labor  -  Contractions monitored with external toco -  Pelvis adequate for trial of labor  -  Plan for expectant management  -  AROM as appropriate  -  Plan for  continuous fetal monitoring -  Maternal pain control as desired; planning regional anesthesia - Anticipate vaginal delivery  5. Post Partum Planning: - Infant feeding: Breast  - Contraception: TBD - Tdap vaccine: given 11/02/2020 - Flu vaccine: given 11/02/2020  Gustavo Lah, CNM 01/08/21 6:06 AM  Margaretmary Eddy, CNM Certified Nurse Midwife [facility]

## 2021-01-08 NOTE — Discharge Summary (Signed)
Obstetrical Discharge Summary  Patient Name: Gail Mullins DOB: 12-01-1985 MRN: [id number]  Date of Admission: 01/08/2021 Date of Delivery: 01/08/2021 Delivered by: Margaretmary Eddy, CNM  Date of Discharge: 01/09/2021  Primary OB: [facility] LMP:No LMP recorded. EDC Estimated Date of Delivery: 01/18/21 Gestational Age at Delivery: [redacted]w[redacted]d   Antepartum complications:  1. Fetal unilateral multicystic dysplastic kidney disease a. Pediatric urology consult done 10/13/2020- see note  b. Does not need inpatient testing after birth  2. Tobacco use in pregnancy  3. Maternal mental health diagnoses  a. Depression and anxiety b. Currently taking Vraylar, transitioning from zoloft to prozac  4. GBS positive  Admitting Diagnosis: active labor  Secondary Diagnosis: Patient Active Problem List   Diagnosis Date Noted  . Indication for care in labor or delivery 01/08/2021  . Fetal polycystic kidney diagnosed prenatally 01/06/2021  . Supervision of high risk pregnancy in third trimester 07/03/2020  . Anxiety in pregnancy, antepartum 06/26/2020  . Anxiety 08/17/2019  . Alcohol abuse   . Acute blood loss anemia   . GERD (gastroesophageal reflux disease)   . Tobacco abuse   . Acute gastrointestinal hemorrhage 08/15/2019    Augmentation: AROM Complications: None Intrapartum complications/course: Victorino Dike presented to L&D in active labor and advanced dilation.  She received an epidural and progressed quickly to C/C/0 with a bulging bag.  AROM was performed for placement of an FSE prior to 2nd dose of antibiotics d/t NRFHR and unable to accurately monitor FHR with an external [place].  FHR resolved with conservative measures and presenting part descended from 0 station to +1 station.  Donnice had a spontaneous urge to push and pushed well over approximately 45 minutes.  Delivery Type: spontaneous vaginal delivery Anesthesia: epidural Placenta: spontaneous Laceration: 1st degree vaginal that  extended into right labial laceration along with left hymenal laceration Episiotomy: none Newborn Data: Live born female  Birth Weight: 7 lb 3 oz (3260 g) APGAR: 6, 8  Newborn Delivery   Birth date/time: 01/08/2021 07:28:00 Delivery type: Vaginal, Spontaneous     Postpartum Procedures: none  Edinburgh:  Edinburgh Postnatal Depression Scale Screening Tool 01/08/2021 01/08/2021  I have been able to laugh and see the funny side of things. 0 (No Data)  I have looked forward with enjoyment to things. 1 -  I have blamed myself unnecessarily when things went wrong. 1 -  I have been anxious or worried for no good reason. 2 -  I have felt scared or panicky for no good reason. 2 -  Things have been getting on top of me. 1 -  I have been so unhappy that I have had difficulty sleeping. 0 -  I have felt sad or miserable. 0 -  I have been so unhappy that I have been crying. 0 -  The thought of harming myself has occurred to me. 0 -  Edinburgh Postnatal Depression Scale Total 7 -     Post partum course:  Patient had an uncomplicated postpartum course.  By time of discharge on PPD#1, her pain was controlled on oral pain medications; she had appropriate lochia and was ambulating, voiding without difficulty and tolerating regular diet.  She was deemed stable for discharge to home.    Discharge Physical Exam:  BP 127/68   Pulse 74   Temp 98.6 F (37 C) (Oral)   Resp 18   Ht 5\' 7"  (1.702 m)   Wt 96.6 kg   SpO2 100%   Breastfeeding Unknown   BMI 33.36 kg/m  General: NAD CV: RRR Pulm: CTABL, nl effort ABD: s/nd/nt, fundus firm and below the umbilicus Lochia: moderate Perineum:minimal edema/intact DVT Evaluation: LE non-ttp, no evidence of DVT on exam.  Hemoglobin  Date Value Ref Range Status  01/09/2021 10.1 (L) 12.0 - 15.0 g/dL Final   HCT  Date Value Ref Range Status  01/09/2021 30.8 (L) 36.0 - 46.0 % Final     Disposition: stable, discharge to home. Baby Feeding: breastmilk   Baby Disposition: home with mom  Rh Immune globulin given: Rh pos Rubella vaccine given: Immune Varivax vaccine given: Immune Flu vaccine given in AP or PP setting: given 11/02/2020 Tdap vaccine given in AP or PP setting: given 11/02/2020  Contraception: undecided  Pren Blood type/Rh A Pos  Antibody screen neg  Rubella Immune  Varicella Immune  RPR NR  HBsAg Neg  HIV NR  GC neg  Chlamydia neg  Genetic screening Not done  1 hour GTT 112  3 hour GTT N/A  GBS Positive   atal Labs:   Plan:  Seeley Southgate was discharged to home in good condition. Follow-up appointment in 2 weeks for mood check and with delivering provider in 6 weeks.  Discharge Medications: Allergies as of 01/09/2021   No Known Allergies     Medication List    STOP taking these medications   folic acid 1 MG tablet Commonly known as: FOLVITE   multivitamin with minerals Tabs tablet   sertraline 25 MG tablet Commonly known as: ZOLOFT   thiamine 100 MG tablet     TAKE these medications   acetaminophen 325 MG tablet Commonly known as: Tylenol Take 2 tablets (650 mg total) by mouth every 4 (four) hours as needed (for pain scale < 4).   benzocaine-Menthol 20-0.5 % Aero Commonly known as: DERMOPLAST Apply 1 application topically as needed for irritation (perineal discomfort).   coconut oil Oil Apply 1 application topically as needed.   diphenhydrAMINE 25 mg capsule Commonly known as: BENADRYL Take 1 capsule (25 mg total) by mouth every 6 (six) hours as needed for itching.   docusate sodium 100 MG capsule Commonly known as: COLACE Take 1 capsule (100 mg total) by mouth 2 (two) times daily.   ferrous sulfate 325 (65 FE) MG tablet Take 1 tablet (325 mg total) by mouth 2 (two) times daily with a meal. What changed: when to take this   FLUoxetine 40 MG capsule Commonly known as: PROZAC Take 40 mg by mouth daily.   ibuprofen 600 MG tablet Commonly known as: ADVIL Take 1 tablet (600  mg total) by mouth every 6 (six) hours.   nicotine 21 mg/24hr patch Commonly known as: NICODERM CQ - dosed in mg/24 hours Place 1 patch (21 mg total) onto the skin daily.   pantoprazole 40 MG tablet Commonly known as: Protonix Take 1 tablet (40 mg total) by mouth 2 (two) times daily.   prenatal vitamin w/FE, FA 27-1 MG Tabs tablet Take 1 tablet by mouth daily at 12 noon.   simethicone 80 MG chewable tablet Commonly known as: MYLICON Chew 1 tablet (80 mg total) by mouth as needed for flatulence.   Vraylar capsule Generic drug: cariprazine Take 1.5 mg by mouth daily.   witch hazel-glycerin pad Commonly known as: TUCKS Apply 1 application topically as needed for hemorrhoids.        Follow-up Information    [facility]. Schedule an appointment as soon as possible for a visit in 2 week(s).   Why: Postpartum mood  check  Contact information: [address] [place] [address] [phone]       Gustavo Lah, CNM. Schedule an appointment as soon as possible for a visit in 6 week(s).   Specialty: Certified Nurse Midwife Why: postpartum visit  Contact information: [address] [place] [address] [phone]               Signed: Randa Ngo, CNM 01/09/2021 8:59 AM

## 2021-01-08 NOTE — Lactation Note (Signed)
This note was copied from a baby's chart. Lactation Consultation Note  Patient Name: Gail Mullins [id number] Date: 01/08/2021 Reason for consult: Initial assessment;1st time breastfeeding;Early term 37-38.6wks Age:35 hours  Mother reached out for help from lactation. Mother is concerned because infant is sleeping and will not feed. LC Student reviewed feeding patterns and normal infant behavior within the first 24 hours. Mother stated she understood. [place] Student advised mother to get some rest and try to stimulate the infant again in 2-3 hours.       Consult Status Consult Status: Follow-up Date: 01/09/21    [name] 01/08/2021, 3:11 PM

## 2021-01-09 LAB — CBC
HCT: 30.8 % — ABNORMAL LOW (ref 36.0–46.0)
Hemoglobin: 10.1 g/dL — ABNORMAL LOW (ref 12.0–15.0)
MCH: 29.4 pg (ref 26.0–34.0)
MCHC: 32.8 g/dL (ref 30.0–36.0)
MCV: 89.8 fL (ref 80.0–100.0)
Platelets: 258 10*3/uL (ref 150–400)
RBC: 3.43 MIL/uL — ABNORMAL LOW (ref 3.87–5.11)
RDW: 12.9 % (ref 11.5–15.5)
WBC: 14.1 10*3/uL — ABNORMAL HIGH (ref 4.0–10.5)
nRBC: 0 % (ref 0.0–0.2)

## 2021-01-09 MED ORDER — COCONUT OIL OIL: 1.0000 "application " | TOPICAL_OIL | 0 refills | Status: DC | PRN

## 2021-01-09 MED ORDER — IBUPROFEN 600 MG PO TABS: 600.0000 mg | ORAL_TABLET | Freq: Four times a day (QID) | ORAL | 0 refills | Status: DC

## 2021-01-09 MED ORDER — BENZOCAINE-MENTHOL 20-0.5 % EX AERO: 1.0000 "application " | INHALATION_SPRAY | CUTANEOUS | Status: DC | PRN

## 2021-01-09 MED ORDER — FERROUS SULFATE 325 (65 FE) MG PO TABS: 325.0000 mg | ORAL_TABLET | Freq: Two times a day (BID) | ORAL | 0 refills | Status: DC

## 2021-01-09 MED ORDER — SIMETHICONE 80 MG PO CHEW: 80.0000 mg | CHEWABLE_TABLET | ORAL | 0 refills | Status: DC | PRN

## 2021-01-09 MED ORDER — DOCUSATE SODIUM 100 MG PO CAPS: 100.0000 mg | ORAL_CAPSULE | Freq: Two times a day (BID) | ORAL | 0 refills | Status: DC

## 2021-01-09 MED ORDER — DIPHENHYDRAMINE HCL 25 MG PO CAPS: 25.0000 mg | ORAL_CAPSULE | Freq: Four times a day (QID) | ORAL | 0 refills | Status: DC | PRN

## 2021-01-09 MED ORDER — ACETAMINOPHEN 325 MG PO TABS: 650.0000 mg | ORAL_TABLET | ORAL | Status: DC | PRN

## 2021-01-09 MED ORDER — WITCH HAZEL-GLYCERIN EX PADS: 1.0000 "application " | MEDICATED_PAD | CUTANEOUS | 12 refills | Status: DC | PRN

## 2021-01-09 NOTE — Anesthesia Postprocedure Evaluation (Signed)
Anesthesia Post Note  Patient: Gail Mullins  Procedure(s) Performed: AN AD HOC LABOR EPIDURAL  Patient location during evaluation: Mother Baby Anesthesia Type: Epidural Level of consciousness: awake and alert Pain management: pain level controlled Vital Signs Assessment: post-procedure vital signs reviewed and stable Respiratory status: spontaneous breathing, nonlabored ventilation and respiratory function stable Cardiovascular status: stable Postop Assessment: no headache, no backache and epidural receding Anesthetic complications: no   No complications documented.   Last Vitals:  Vitals:   01/08/21 1929 01/08/21 2307  BP: 122/65 127/68  Pulse: 78 74  Resp: 18 18  Temp: 36.6 C 37 C  SpO2: 98% 100%    Last Pain:  Vitals:   01/09/21 0526  TempSrc:   PainSc: 0-No pain                 [name]

## 2021-01-09 NOTE — Discharge Instructions (Signed)
Vaginal Delivery, Care After Refer to this sheet in the next few weeks. These discharge instructions provide you with information on caring for yourself after delivery. Your caregiver may also give you specific instructions. Your treatment has been planned according to the most current medical practices available, but problems sometimes occur. Call your caregiver if you have any problems or questions after you go home. HOME CARE INSTRUCTIONS 1. Take over-the-counter or prescription medicines only as directed by your caregiver or pharmacist. 2. Do not drink alcohol, especially if you are breastfeeding or taking medicine to relieve pain. 3. Do not smoke tobacco. 4. Continue to use good perineal care. Good perineal care includes: 1. Wiping your perineum from back to front 2. Keeping your perineum clean. 3. You can do sitz baths twice a day, to help keep this area clean 5. Do not use tampons, douche or have sex until your caregiver says it is okay. 6. Shower only and avoid sitting in submerged water, aside from sitz baths 7. Wear a well-fitting bra that provides breast support. 8. Eat healthy foods. 9. Drink enough fluids to keep your urine clear or pale yellow. 10. Eat high-fiber foods such as whole grain cereals and breads, brown rice, beans, and fresh fruits and vegetables every day. These foods may help prevent or relieve constipation. 11. Avoid constipation with high fiber foods or medications, such as miralax or metamucil 12. Follow your caregiver's recommendations regarding resumption of activities such as climbing stairs, driving, lifting, exercising, or traveling. 13. Talk to your caregiver about resuming sexual activities. Resumption of sexual activities is dependent upon your risk of infection, your rate of healing, and your comfort and desire to resume sexual activity. 14. Try to have someone help you with your household activities and your newborn for at least a few days after you leave  the hospital. 15. Rest as much as possible. Try to rest or take a nap when your newborn is sleeping. 16. Increase your activities gradually. 17. Keep all of your scheduled postpartum appointments. It is very important to keep your scheduled follow-up appointments. At these appointments, your caregiver will be checking to make sure that you are healing physically and emotionally. SEEK MEDICAL CARE IF:   You are passing large clots from your vagina. Save any clots to show your caregiver.  You have a foul smelling discharge from your vagina.  You have trouble urinating.  You are urinating frequently.  You have pain when you urinate.  You have a change in your bowel movements.  You have increasing redness, pain, or swelling near your vaginal incision (episiotomy) or vaginal tear.  You have pus draining from your episiotomy or vaginal tear.  Your episiotomy or vaginal tear is separating.  You have painful, hard, or reddened breasts.  You have a severe headache.  You have blurred vision or see spots.  You feel sad or depressed.  You have thoughts of hurting yourself or your newborn.  You have questions about your care, the care of your newborn, or medicines.  You are dizzy or light-headed.  You have a rash.  You have nausea or vomiting.  You were breastfeeding and have not had a menstrual period within 12 weeks after you stopped breastfeeding.  You are not breastfeeding and have not had a menstrual period by the 12th week after delivery.  You have a fever. SEEK IMMEDIATE MEDICAL CARE IF:   You have persistent pain.  You have chest pain.  You have shortness of breath.    You faint.  You have leg pain.  You have stomach pain.  Your vaginal bleeding saturates two or more sanitary pads in 1 hour. MAKE SURE YOU:   Understand these instructions.  Will watch your condition.  Will get help right away if you are not doing well or get worse. Document Released:  09/20/2000 Document Revised: 02/07/2014 Document Reviewed: 05/20/2012 [facility] Patient Information 2015 [place], [place]. This information is not intended to replace advice given to you by your health care provider. Make sure you discuss any questions you have with your health care provider.  Sitz Bath A sitz bath is a warm water bath taken in the sitting position. The water covers only the hips and butt (buttocks). We recommend using one that fits in the toilet, to help with ease of use and cleanliness. It may be used for either healing or cleaning purposes. Sitz baths are also used to relieve pain, itching, or muscle tightening (spasms). The water may contain medicine. Moist heat will help you heal and relax.  HOME CARE  Take 3 to 4 sitz baths a day. 18. Fill the bathtub half-full with warm water. 19. Sit in the water and open the drain a little. 20. Turn on the warm water to keep the tub half-full. Keep the water running constantly. 21. Soak in the water for 15 to 20 minutes. 22. After the sitz bath, pat the affected area dry. GET HELP RIGHT AWAY IF: You get worse instead of better. Stop the sitz baths if you get worse. MAKE SURE YOU:  Understand these instructions.  Will watch your condition.  Will get help right away if you are not doing well or get worse.   Breastfeeding and Breast Care It is normal to have some problems when you start to breastfeed your new baby. But there are things that you can do to take care of yourself and help prevent problems. This includes keeping your breasts healthy and making sure that your baby's mouth attaches (latches) properly to your nipple for feedings. Work with your doctor or breastfeeding specialist to find what works best for you. How does self-care benefit me? If you keep your breasts healthy and you let your baby attach to your nipples in the right way, you will avoid these problems:  Cracked or sore nipples.  Breasts becoming overfilled  with milk.  Plugged milk ducts.  Low milk supply.  Breast swelling or infection. How does self-care benefit my baby? By preventing problems with your breasts, you will ensure that your baby will feed well and will gain the right amount of weight. What actions can I take to care for myself during breastfeeding? Best ways to breastfeed  Always make sure that your baby latches properly to breastfeed.  Make sure that your baby is in a proper position. Try different breastfeeding positions to find one that works best for you and your baby.  Breastfeed when you feel like you need to make your breasts less full or when your baby shows signs of hunger. This is called "breastfeeding on demand."  Do not delay feedings.  Try to relax when it is time to feed your baby. This helps your body release milk from your breast.  To help increase milk flow, do these things before feeding: ? Remove a small amount of milk from your breast. Use a pump or squeeze with your hand. ? Apply warm, moist heat to your breast. Do this in the shower or use hand towels soaked with warm  water. ? Massage your breasts. Do this when you are breastfeeding as well. Caring for your breasts  To help your breasts stay healthy and keep them from getting too dry: ? Avoid using soap on your nipples. ? Let your nipples air-dry for 3-4 minutes after each feeding.  Do not use things like a hair dryer to dry your breasts. This can make the skin dry and will cause irritation and pain. ? Use only cotton bra pads to soak up breast milk that leaks. Change the pads if they become soaked with milk. If you use bra pads that can be thrown away, change them often. ? Put some lanolin on your nipples after breastfeeding. Pure lanolin does not need to be washed off your nipple before you feed your baby again. Pure lanolin is not harmful to your baby. ? Rub some breast milk into your nipples:  Use your hand to squeeze out a few drops of  breast milk.  Gently massage the milk into your nipples.  Let your nipples air-dry.  Wear a supportive nursing bra. Avoid wearing: ? Tight clothing. ? Underwire bras or bras that put pressure on your breasts.  Use ice to help relieve pain or swelling of your breasts: ? Put ice in a plastic bag. ? Place a towel between your skin and the bag. ? Leave the ice on for 20 minutes, 2-3 times a day.      Follow these instructions at home:  Drink enough fluid to keep your pee (urine) pale yellow.  Get plenty of rest. Sleep when your baby sleeps.  Talk to your doctor or breastfeeding specialist before taking any herbal supplements.  Eat a balanced diet. This includes fruits, vegetables, whole grains, lean proteins, and dairy or dairy alternatives Contact a health care provider if:  You have nipple pain.  You have cracking or soreness in your nipples that lasts longer than 1 week.  Your breasts are overfilled with milk, and this lasts longer than 48 hours.  You have a fever.  You have pus-like fluid coming from your nipple.  You have redness, a rash, swelling, itching, or burning on your breast.  Your baby does not gain weight.  Your baby loses weight.  Your baby is not feeding regularly or is very sleepy and lacks energy. Summary  There are things that you can do to take care of yourself and help prevent many common breastfeeding problems.  Always make sure that your baby's mouth attaches (latches) to your nipple properly to breastfeed.  Keep your nipples from getting too dry, drink plenty of fluid, and get plenty of rest.  Feed on demand. Do not delay feedings. This information is not intended to replace advice given to you by your health care provider. Make sure you discuss any questions you have with your health care provider. Document Revised: 03/14/2020 Document Reviewed: 03/14/2020 Elsevier Patient Education  2021 [employer].

## 2021-01-09 NOTE — Lactation Note (Signed)
This note was copied from a baby's chart. Lactation Consultation Note  Patient Name: Gail Mullins [id number] Date: 01/09/2021   Age:35 hours       LC Mullins completed follow up visit. LC Mullins was able to observe infant feeding, mom already had infant skin to skin and at breast. Mom needed slight adjustment with hand position and support with pillows. During feeding, LC was able to review day two information on what to expect, feeding cues, voids and stages of cluster feeding. LC noticed mom continue to keep baby stimulated as needed at breast, discussed hand compressions and reviewed over hand expression.  Mom is to feed infant on demand at least x8/24hrs, keep infant alert at breast, skin to skin, and to reach out to lactation services as needed.     LATCH Score Latch: Repeated attempts needed to sustain latch, nipple held in mouth throughout feeding, stimulation needed to elicit sucking reflex.  Audible Swallowing: Spontaneous and intermittent  Type of Nipple: Everted at rest and after stimulation  Comfort (Breast/Nipple): Soft / non-tender  Hold (Positioning): Assistance needed to correctly position infant at breast and maintain latch.  LATCH Score: 8   Lactation Tools Discussed/Used  Mom has a personal double electric pump    Consult Status  Inpatient lactation follow up     Gail Mullins  01/09/2021, 11:19 AM

## 2021-01-09 NOTE — Progress Notes (Signed)
All discharge instructions reviewed with pt; pt knows to get meds in the morning from her pharmacy; pt knows when to schedule follow up appointments; pt still staying in hospital due to baby having to stay; pt will be discharged and baby will be the pt now

## 2021-01-10 ENCOUNTER — Ambulatory Visit: Payer: Self-pay

## 2021-01-10 NOTE — Lactation Note (Signed)
This note was copied from a baby's chart. Lactation Consultation Note  Patient Name: Gail Mullins [id number] Date: 01/10/2021 Reason for consult: Follow-up assessment;1st time breastfeeding;Early term 37-38.6wks Age:35 hours  Lactation follow-up before discharge. Baby continuing to feed well, voiding and stooling appropriate for DOL. Mom has some tenderness but no visible damage.   Reviewed feeding cues, position/latching, feeding on demand, anticipation of growth spurts/cluster feeding, changes in stool over the coming days.  Information given for breast and nipple changes, management of breast fullness and engorgement, occasional use of home DEBP as needed, and outpatient lactation support.  Encouraged to call for support or with questions/concerns.  Maternal Data Has patient been taught Hand Expression?: Yes Does the patient have breastfeeding experience prior to this delivery?: No  Feeding Mother's Current Feeding Choice: Breast Milk  LATCH Score                    Lactation Tools Discussed/Used    Interventions Interventions: Breast feeding basics reviewed;Education  Discharge Discharge Education: Engorgement and breast care;Warning signs for feeding baby;Outpatient recommendation Pump: Personal (at home)  Consult Status Consult Status: Complete Date: 01/10/21 Follow-up type: Call as needed    Danford Bad 01/10/2021, 9:01 AM

## 2021-01-24 DIAGNOSIS — Z8659 Personal history of other mental and behavioral disorders: Secondary | ICD-10-CM | POA: Diagnosis not present

## 2021-01-24 DIAGNOSIS — Z1331 Encounter for screening for depression: Secondary | ICD-10-CM | POA: Diagnosis not present

## 2021-04-17 ENCOUNTER — Encounter: Payer: Self-pay | Admitting: *Deleted

## 2021-04-17 ENCOUNTER — Other Ambulatory Visit: Payer: Self-pay

## 2021-04-17 DIAGNOSIS — R112 Nausea with vomiting, unspecified: Secondary | ICD-10-CM | POA: Diagnosis present

## 2021-04-17 DIAGNOSIS — F17210 Nicotine dependence, cigarettes, uncomplicated: Secondary | ICD-10-CM | POA: Diagnosis not present

## 2021-04-17 DIAGNOSIS — R519 Headache, unspecified: Secondary | ICD-10-CM | POA: Insufficient documentation

## 2021-04-17 DIAGNOSIS — F10920 Alcohol use, unspecified with intoxication, uncomplicated: Secondary | ICD-10-CM | POA: Diagnosis not present

## 2021-04-17 LAB — COMPREHENSIVE METABOLIC PANEL
ALT: 24 U/L (ref 0–44)
AST: 25 U/L (ref 15–41)
Albumin: 4.5 g/dL (ref 3.5–5.0)
Alkaline Phosphatase: 69 U/L (ref 38–126)
Anion gap: 9 (ref 5–15)
BUN: 9 mg/dL (ref 6–20)
CO2: 20 mmol/L — ABNORMAL LOW (ref 22–32)
Calcium: 8.8 mg/dL — ABNORMAL LOW (ref 8.9–10.3)
Chloride: 112 mmol/L — ABNORMAL HIGH (ref 98–111)
Creatinine, Ser: 0.66 mg/dL (ref 0.44–1.00)
GFR, Estimated: >60 mL/min (ref >60)
Glucose, Bld: 108 mg/dL — ABNORMAL HIGH (ref 70–99)
Potassium: 4.2 mmol/L (ref 3.5–5.1)
Sodium: 141 mmol/L (ref 135–145)
Total Bilirubin: 0.4 mg/dL (ref 0.3–1.2)
Total Protein: 7.7 g/dL (ref 6.5–8.1)

## 2021-04-17 LAB — URINALYSIS, COMPLETE (UACMP) WITH MICROSCOPIC
Bilirubin Urine: NEGATIVE
Glucose, UA: NEGATIVE mg/dL
Ketones, ur: 5 mg/dL — AB
Nitrite: NEGATIVE
Protein, ur: NEGATIVE mg/dL
Specific Gravity, Urine: 1.018 (ref 1.005–1.030)
pH: 5 (ref 5.0–8.0)

## 2021-04-17 LAB — CBC
HCT: 41.2 % (ref 36.0–46.0)
Hemoglobin: 13.4 g/dL (ref 12.0–15.0)
MCH: 29.1 pg (ref 26.0–34.0)
MCHC: 32.5 g/dL (ref 30.0–36.0)
MCV: 89.4 fL (ref 80.0–100.0)
Platelets: 320 10*3/uL (ref 150–400)
RBC: 4.61 MIL/uL (ref 3.87–5.11)
RDW: 14.5 % (ref 11.5–15.5)
WBC: 15.3 10*3/uL — ABNORMAL HIGH (ref 4.0–10.5)
nRBC: 0 % (ref 0.0–0.2)

## 2021-04-17 LAB — POC URINE PREG, ED: Preg Test, Ur: NEGATIVE

## 2021-04-17 LAB — LIPASE, BLOOD: Lipase: 38 U/L (ref 11–51)

## 2021-04-17 MED ORDER — ONDANSETRON 4 MG PO TBDP
4.0000 mg | ORAL_TABLET | Freq: Once | ORAL | Status: AC | PRN
  Administered 2021-04-17: 4 mg via ORAL
  Filled 2021-04-17: qty 1

## 2021-04-17 NOTE — ED Triage Notes (Addendum)
Pt reports vomiting this afternoon and body aches all over. Unsure of fevers, denies diarrhea. 4 beers this afternoon

## 2021-04-18 ENCOUNTER — Emergency Department: Payer: BLUE CROSS/BLUE SHIELD

## 2021-04-18 ENCOUNTER — Emergency Department
Admission: EM | Admit: 2021-04-18 | Discharge: 2021-04-18 | Disposition: A | Discharge status: Home / Self Care | Payer: BLUE CROSS/BLUE SHIELD | Attending: Emergency Medicine | Admitting: Emergency Medicine

## 2021-04-18 DIAGNOSIS — F10929 Alcohol use, unspecified with intoxication, unspecified: Secondary | ICD-10-CM

## 2021-04-18 DIAGNOSIS — K920 Hematemesis: Secondary | ICD-10-CM

## 2021-04-18 LAB — HEMOGLOBIN AND HEMATOCRIT, BLOOD
HCT: 37.2 % (ref 36.0–46.0)
Hemoglobin: 12.2 g/dL (ref 12.0–15.0)

## 2021-04-18 MED ORDER — PROCHLORPERAZINE EDISYLATE 10 MG/2ML IJ SOLN
10.0000 mg | Freq: Once | INTRAMUSCULAR | Status: AC
  Administered 2021-04-18: 10 mg via INTRAVENOUS
  Filled 2021-04-18: qty 2

## 2021-04-18 MED ORDER — ONDANSETRON 4 MG PO TBDP
4.0000 mg | ORAL_TABLET | Freq: Three times a day (TID) | ORAL | 0 refills | Status: DC | PRN
Start: 2021-04-18 — End: 2023-05-31

## 2021-04-18 MED ORDER — ACETAMINOPHEN 500 MG PO TABS
1000.0000 mg | ORAL_TABLET | Freq: Once | ORAL | Status: AC
  Administered 2021-04-18: 1000 mg via ORAL
  Filled 2021-04-18: qty 2

## 2021-04-18 MED ORDER — KETOROLAC TROMETHAMINE 30 MG/ML IJ SOLN
15.0000 mg | Freq: Once | INTRAMUSCULAR | Status: DC
  Filled 2021-04-18: qty 1

## 2021-04-18 MED ORDER — LACTATED RINGERS IV BOLUS
1000.0000 mL | Freq: Once | INTRAVENOUS | Status: AC
  Administered 2021-04-18: 1000 mL via INTRAVENOUS

## 2021-04-18 MED ORDER — METOCLOPRAMIDE HCL 5 MG/ML IJ SOLN
10.0000 mg | Freq: Once | INTRAMUSCULAR | Status: DC
  Filled 2021-04-18: qty 2

## 2021-04-18 NOTE — ED Provider Notes (Signed)
[facility] Emergency Department Provider Note  ____________________________________________  Time seen: Approximately 2:17 AM  I have reviewed the triage vital signs and the nursing notes.   HISTORY  Chief Complaint Emesis   HPI Gail Mullins is a 35 y.o. female history of alcohol abuse, Mallory-Weiss tear, anxiety, and depression who presents for evaluation of headache and vomiting.  Patient reports that she had 4 beers today.  Had several episodes of vomiting.  She is complaining of hematemesis and concerns for another esophageal tear.  She denies any chest pain or abdominal pain, no melena.  Patient reports very forcefully episodes of emesis in the setting of alcohol intake.  She denies body aches and fevers even though triage note states diffuse body aches.  She denies sore throat, cough, shortness of breath, diarrhea, abdominal pain, vaginal bleeding, vaginal discharge, dysuria or hematuria.  She is complaining of a generalized throbbing migraine.  She has a history of migraines.  Reports that her headaches identical to her prior migraines.  Has had photophobia associated with it.  No thunderclap headache   Past Medical History:  Diagnosis Date   Alcohol abuse    Anxiety    Depression     Patient Active Problem List   Diagnosis Date Noted   Indication for care in labor or delivery 01/08/2021   Fetal polycystic kidney diagnosed prenatally 01/06/2021   Supervision of high risk pregnancy in third trimester 07/03/2020   Anxiety in pregnancy, antepartum 06/26/2020   Anxiety 08/17/2019   Alcohol abuse    Acute blood loss anemia    GERD (gastroesophageal reflux disease)    Tobacco abuse    Acute gastrointestinal hemorrhage 08/15/2019    Past Surgical History:  Procedure Laterality Date   ESOPHAGOGASTRODUODENOSCOPY N/A 08/15/2019   Procedure: ESOPHAGOGASTRODUODENOSCOPY (EGD);  Surgeon: Toledo, Boykin Nearing, MD;  Location: [facility] ENDOSCOPY;  Service:  Gastroenterology;  Laterality: N/A;    Prior to Admission medications   Medication Sig Start Date End Date Taking? Authorizing Provider  ondansetron (ZOFRAN ODT) 4 MG disintegrating tablet Take 1 tablet (4 mg total) by mouth every 8 (eight) hours as needed. 04/18/21  Yes Don Perking, [place], [place]  acetaminophen (TYLENOL) 325 MG tablet Take 2 tablets (650 mg total) by mouth every 4 (four) hours as needed (for pain scale < 4). 01/09/21   McVey, Prudencio Pair, CNM  benzocaine-Menthol (DERMOPLAST) 20-0.5 % AERO Apply 1 application topically as needed for irritation (perineal discomfort). 01/09/21   McVey, Prudencio Pair, CNM  coconut oil OIL Apply 1 application topically as needed. 01/09/21   McVey, Prudencio Pair, CNM  diphenhydrAMINE (BENADRYL) 25 mg capsule Take 1 capsule (25 mg total) by mouth every 6 (six) hours as needed for itching. 01/09/21   McVey, Prudencio Pair, CNM  docusate sodium (COLACE) 100 MG capsule Take 1 capsule (100 mg total) by mouth 2 (two) times daily. 01/09/21   McVey, Prudencio Pair, CNM  ferrous sulfate 325 (65 FE) MG tablet Take 1 tablet (325 mg total) by mouth 2 (two) times daily with a meal. 01/09/21   McVey, Prudencio Pair, CNM  FLUoxetine (PROZAC) 40 MG capsule Take 40 mg by mouth daily.    [provider]  ibuprofen (ADVIL) 600 MG tablet Take 1 tablet (600 mg total) by mouth every 6 (six) hours. 01/09/21   McVey, Prudencio Pair, CNM  nicotine (NICODERM CQ - DOSED IN MG/24 HOURS) 21 mg/24hr patch Place 1 patch (21 mg total) onto the skin daily. 08/18/19   Lorretta Harp, MD  pantoprazole (PROTONIX) 40 MG tablet Take 1 tablet (40 mg total) by mouth 2 (two) times daily. 08/17/19 08/16/20  Lorretta Harp, MD  prenatal vitamin w/FE, FA (PRENATAL 1 + 1) 27-1 MG TABS tablet Take 1 tablet by mouth daily at 12 noon.    [provider]  simethicone (MYLICON) 80 MG chewable tablet Chew 1 tablet (80 mg total) by mouth as needed for flatulence. 01/09/21   McVey, Prudencio Pair, CNM  VRAYLAR capsule Take 1.5 mg by mouth daily.  12/21/20   [provider]  witch hazel-glycerin (TUCKS) pad Apply 1 application topically as needed for hemorrhoids. 01/09/21   McVey, Prudencio Pair, CNM    Allergies Patient has no known allergies.  Family History  Problem Relation Age of Onset   Breast cancer Neg Hx     Social History Social History   Tobacco Use   Smoking status: Every Day    Packs/day: 0.50    Years: 15.00    Pack years: 7.50    Types: Cigarettes   Smokeless tobacco: Never  Substance Use Topics   Alcohol use: Not Currently   Drug use: Not Currently    Review of Systems  Constitutional: Negative for fever. Eyes: Negative for visual changes. ENT: Negative for sore throat. Neck: No neck pain  Cardiovascular: Negative for chest pain. Respiratory: Negative for shortness of breath. Gastrointestinal: Negative for abdominal pain or diarrhea. + hematemesis Genitourinary: Negative for dysuria. Musculoskeletal: Negative for back pain. Skin: Negative for rash. Neurological: Negative for weakness or numbness. + HA Psych: No SI or HI  ____________________________________________   PHYSICAL EXAM:  VITAL SIGNS: ED Triage Vitals  Enc Vitals Group     BP 04/17/21 2207 138/88     Pulse Rate 04/17/21 2207 88     Resp 04/17/21 2207 16     Temp 04/17/21 2207 98 F (36.7 C)     Temp Source 04/17/21 2207 Oral     SpO2 04/17/21 2207 96 %     Weight --      Height --      Head Circumference --      Peak Flow --      Pain Score 04/17/21 2205 0     Pain Loc --      Pain Edu? --      Excl. in GC? --     Constitutional: Alert and oriented. Well appearing and in no apparent distress. HEENT:      Head: Normocephalic and atraumatic.         Eyes: Conjunctivae are normal. Sclera is non-icteric. PERRL, EOMI      Mouth/Throat: Mucous membranes are moist.       Neck: Supple with no signs of meningismus. Cardiovascular: Regular rate and rhythm. No murmurs, gallops, or rubs. 2+ symmetrical distal pulses are  present in all extremities. No JVD. Respiratory: Normal respiratory effort. Lungs are clear to auscultation bilaterally.  Gastrointestinal: Soft, non tender, and non distended with positive bowel sounds. No rebound or guarding. Musculoskeletal:  No edema, cyanosis, or erythema of extremities. Neurologic: Normal speech and language. Face is symmetric. Moving all extremities. No gross focal neurologic deficits are appreciated. Skin: Skin is warm, dry and intact. No rash noted. Psychiatric: Mood and affect are normal. Speech and behavior are normal.  ____________________________________________   LABS (all labs ordered are listed, but only abnormal results are displayed)  Labs Reviewed  COMPREHENSIVE METABOLIC PANEL - Abnormal; Notable for the following components:      Result Value  Chloride 112 (*)    CO2 20 (*)    Glucose, Bld 108 (*)    Calcium 8.8 (*)    All other components within normal limits  CBC - Abnormal; Notable for the following components:   WBC 15.3 (*)    All other components within normal limits  URINALYSIS, COMPLETE (UACMP) WITH MICROSCOPIC - Abnormal; Notable for the following components:   Color, Urine YELLOW (*)    APPearance CLEAR (*)    Hgb urine dipstick MODERATE (*)    Ketones, ur 5 (*)    Leukocytes,Ua TRACE (*)    Bacteria, UA RARE (*)    All other components within normal limits  LIPASE, BLOOD  HEMOGLOBIN AND HEMATOCRIT, BLOOD  POC URINE PREG, ED   ____________________________________________  EKG  none  ____________________________________________  RADIOLOGY  I have personally reviewed the images performed during this visit and I agree with the Radiologist's read.   Interpretation by Radiologist:  DG Chest 2 View  Result Date: 04/18/2021 CLINICAL DATA:  Nausea and vomiting EXAM: CHEST - 2 VIEW COMPARISON:  08/15/2019 FINDINGS: The heart size and mediastinal contours are within normal limits. Both lungs are clear. The visualized skeletal  structures are unremarkable. IMPRESSION: No active cardiopulmonary disease. Electronically Signed   By: Alcide Clever M.D.   On: 04/18/2021 02:25    ____________________________________________   PROCEDURES  Procedure(s) performed: None Procedures Critical Care performed:  None ____________________________________________   INITIAL IMPRESSION / ASSESSMENT AND PLAN / ED COURSE  34 y.o. female history of alcohol abuse, Mallory-Weiss tear, anxiety, and depression who presents for evaluation of headache and vomiting.   # hematemesis in the setting of alcohol use: Patient is hemodynamically stable with normal hemoglobin.  There is no abdominal tenderness.  Denies melena.  Review of old medical record shows admission from 2020 where patient had hematemesis.  She had an EGD that showed a Mallory-Weiss tear.  Chest x-ray with no free air, visualized by me and confirmed by radiology.  Patient is otherwise hemodynamically stable.  Possibly Mallory-Weiss tear versus peptic ulcer disease versus alcoholic gastritis.   #HA; patient with one of her migraine headaches.  Describes the headache is identical to her migraine.  No thunderclap headache.  Neurologically intact.  No meningeal signs.  Will treat with a migraine cocktail including IV fluids, IV Compazine, and p.o. Tylenol.  _________________________ 3:35 AM on 04/18/2021 -----------------------------------------   Repeat H&H showing a drop on hgb from 13.4 to 12.2 and HCT from 41.2 to 37.2. Patient has no melena. Reports not having another episode of emesis for a few hours at this point.  With prior EGD showing Mallory-Weiss tear and a drop in hemoglobin we did discuss admission for trending of her hemoglobin and possible GI evaluation plus or minus EGD if patient has any further episodes of upper GI bleed or if hemoglobin continued to trend down.  Patient is requesting to go home.  Reports that she feels improved.  She denies any pain at this time.   She has not had any further episodes of hematemesis and at least a couple of hours.  She will follow-up with her PCP and promises to return to the emergency room if she has any further episodes of hematemesis or melena at home.  Considering the patient is healthy and hemodynamically stable I think this is a reasonable plan.  She does feel improved after receiving medications for her migraine.  Will discharge home at this time with close follow-up with PCP.  _____________________________________________  Please note:  Patient was evaluated in Emergency Department today for the symptoms described in the history of present illness. Patient was evaluated in the context of the global COVID-19 pandemic, which necessitated consideration that the patient might be at risk for infection with the SARS-CoV-2 virus that causes COVID-19. Institutional protocols and algorithms that pertain to the evaluation of patients at risk for COVID-19 are in a state of rapid change based on information released by regulatory bodies including the CDC and federal and state organizations. These policies and algorithms were followed during the patient's care in the ED.  Some ED evaluations and interventions may be delayed as a result of limited staffing during the pandemic.   [place] Controlled Substance Database was reviewed by me. ____________________________________________   FINAL CLINICAL IMPRESSION(S) / ED DIAGNOSES   Final diagnoses:  Hematemesis with nausea  Alcoholic intoxication with complication (HCC)      NEW MEDICATIONS STARTED DURING THIS VISIT:  ED Discharge Orders          Ordered    ondansetron (ZOFRAN ODT) 4 MG disintegrating tablet  Every 8 hours PRN        04/18/21 0334             Note:  This document was prepared using Dragon voice recognition software and may include unintentional dictation errors.    Don PerkingVeronese, [place], MD 04/18/21 [phone]

## 2021-04-18 NOTE — ED Notes (Signed)
ED Provider at bedside. 

## 2021-04-18 NOTE — Discharge Instructions (Addendum)
As we discussed, return to the emergency room if you have any further episodes of vomiting blood, if your stool looks black or bloody, or if you have any chest or abdominal pain.  Otherwise follow-up with your primary care doctor or your GI doctor as soon as possible.

## 2021-04-18 NOTE — ED Notes (Signed)
Patient reports vomiting blood x several episodes today. Patient denies abdominal pain. Patient denies diarrhea. Patient reports nausea is still present, but vomiting has improved. Patient reports IUD placed yesterday morning. Patient denies pelvic pain. Patient denies vaginal bleeding.

## 2021-05-09 IMAGING — DX DG CHEST 1V PORT
1 series · 1 of 1 positions shown · non-contrast
Comparison: None.

CLINICAL DATA: 33-year-old female with vomiting

EXAM:
PORTABLE CHEST 1 VIEW

[chest ap]
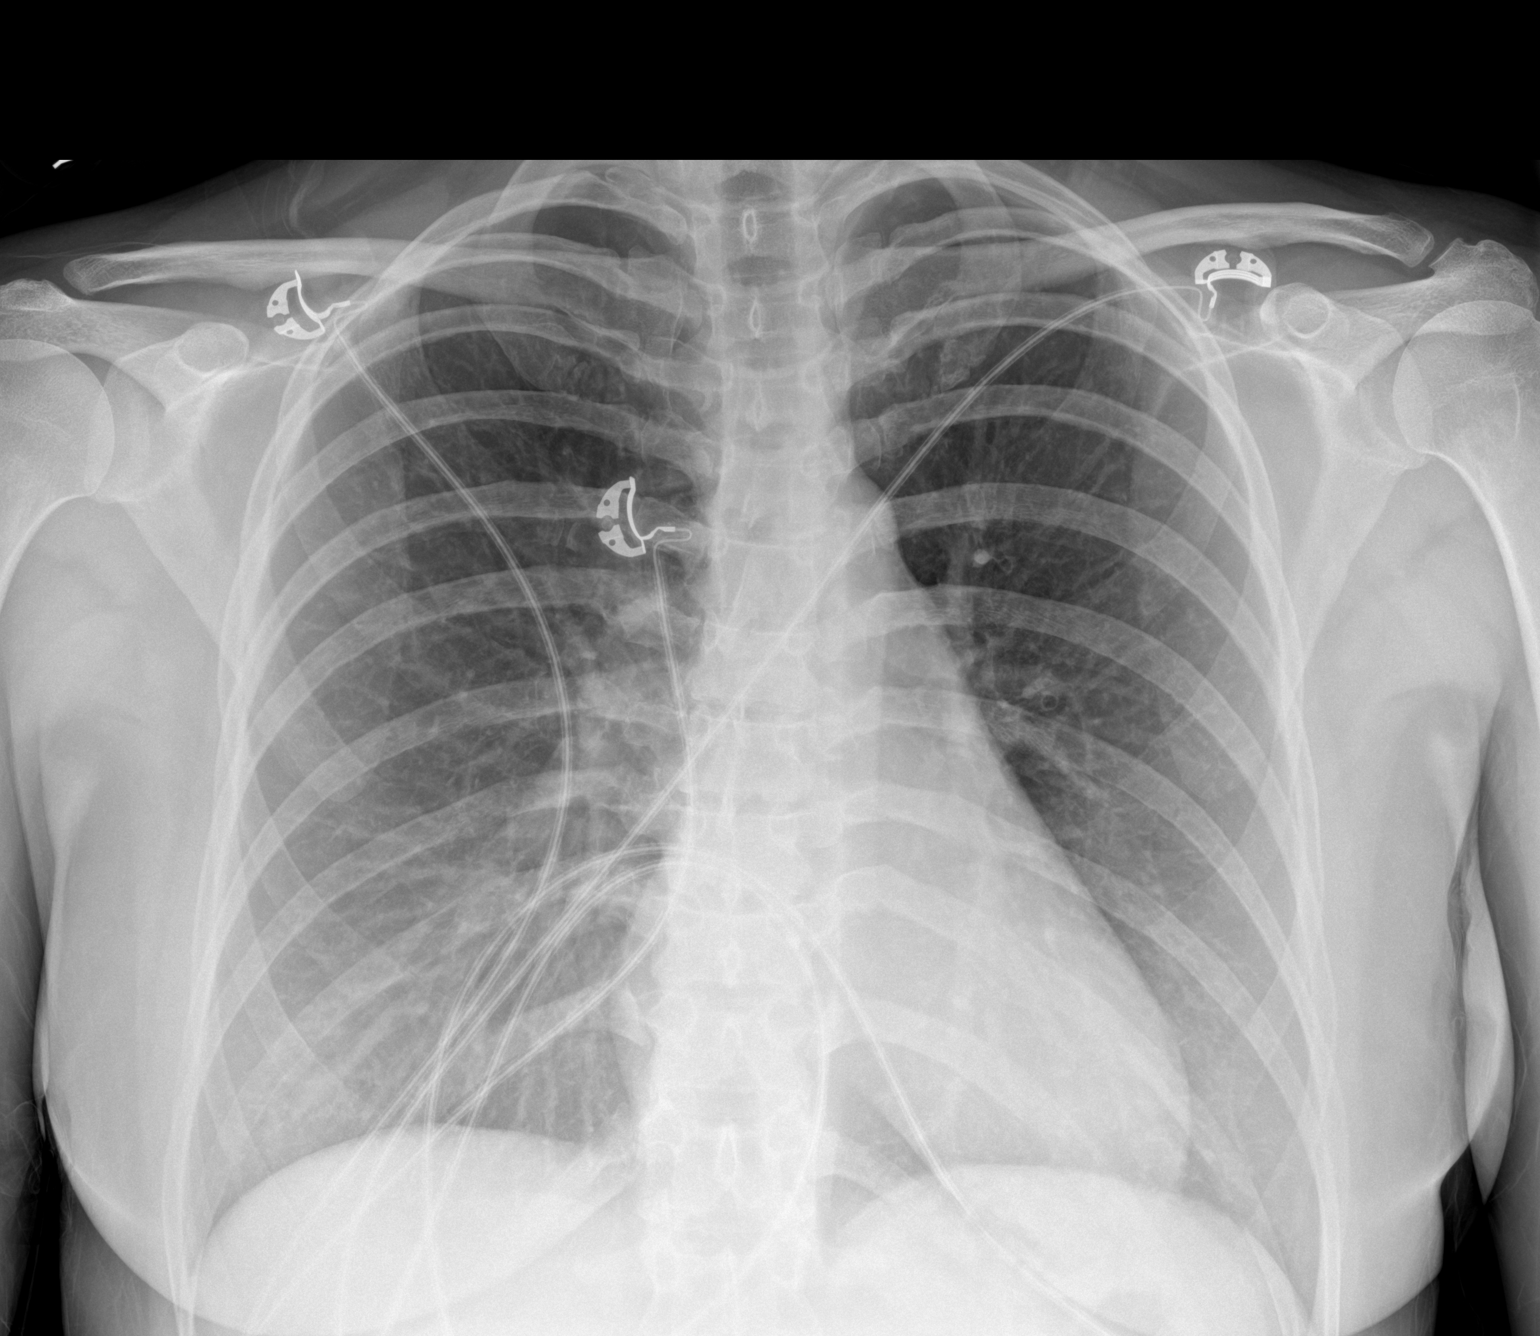

[1 of 1 positions shown; findings below may reference images not displayed]

FINDINGS: The heart size and mediastinal contours are within normal limits.
Both lungs are clear. The visualized skeletal structures are
unremarkable.
IMPRESSION: Negative for acute cardiopulmonary disease

## 2021-08-18 IMAGING — MG DIGITAL DIAGNOSTIC BILAT W/ TOMO W/ CAD
8 of 14 series · 8 of 40 positions shown · non-contrast
Comparison: Baseline evaluation

CLINICAL DATA: Palpable abnormality in the 12 o'clock retroareolar
region of the RIGHT breast for a couple of months. Initially mass
was tender, requiring the patient to use ice packs. Mass has become
less and less tender over last 2 months. On some days she notes pain
in the mass and others, no pain. This lesion is not tender today.

EXAM:
DIGITAL DIAGNOSTIC BILATERAL MAMMOGRAM WITH CAD AND TOMO
ULTRASOUND RIGHT BREAST

[L CC synth-2D]
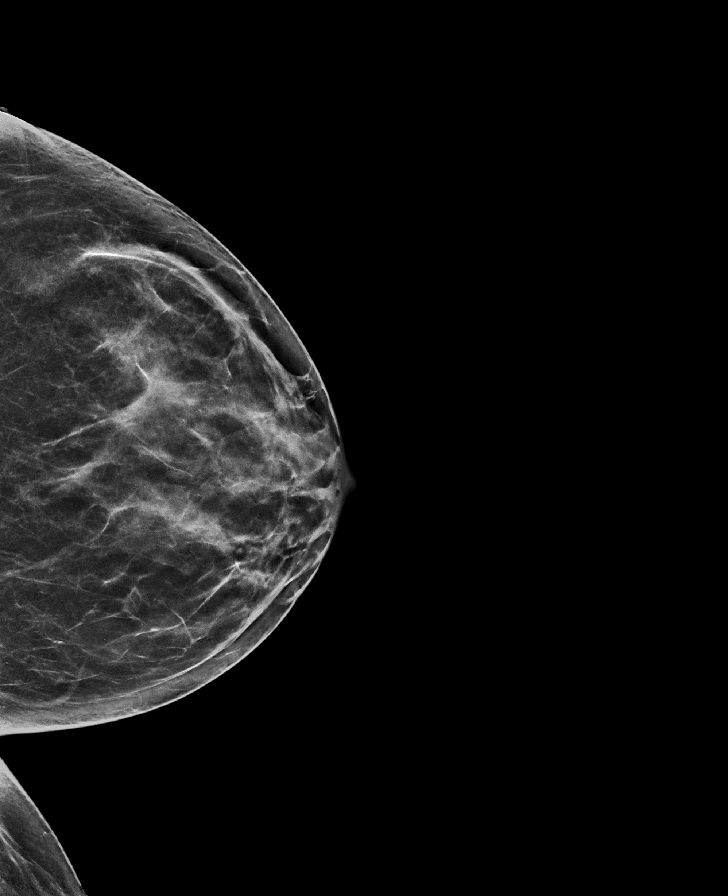

[L MLO synth-2D]
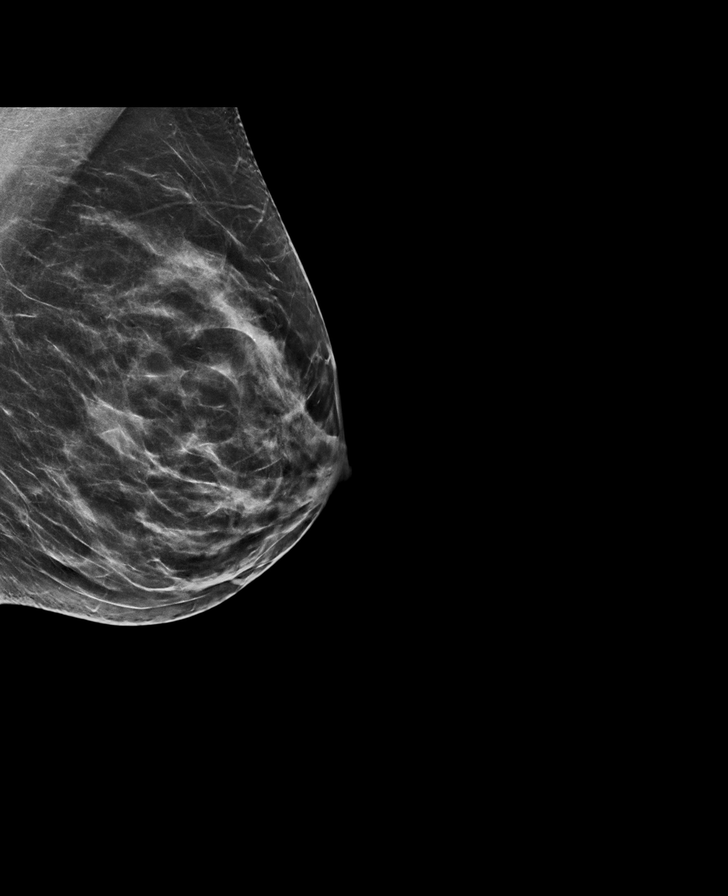

[R CC synth-2D (1 of 2)]
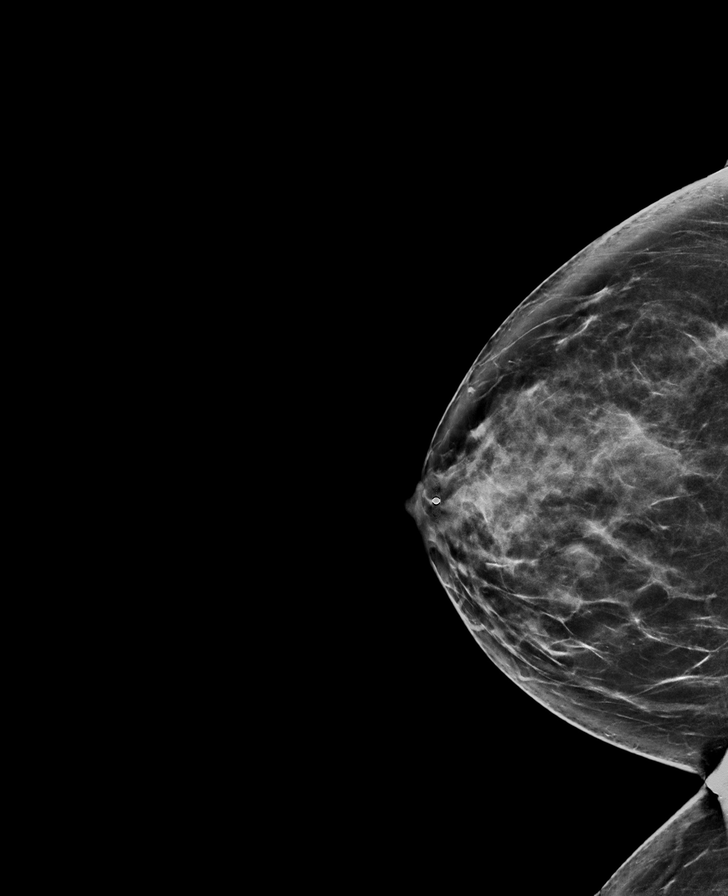

[R MLO synth-2D (1 of 3)]
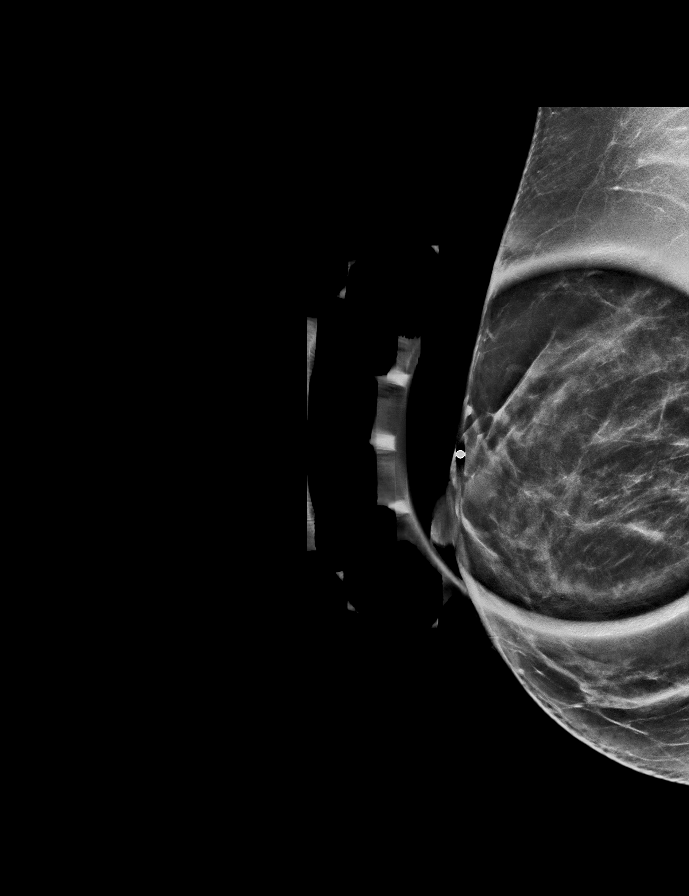

[R MLO synth-2D (2 of 3)]
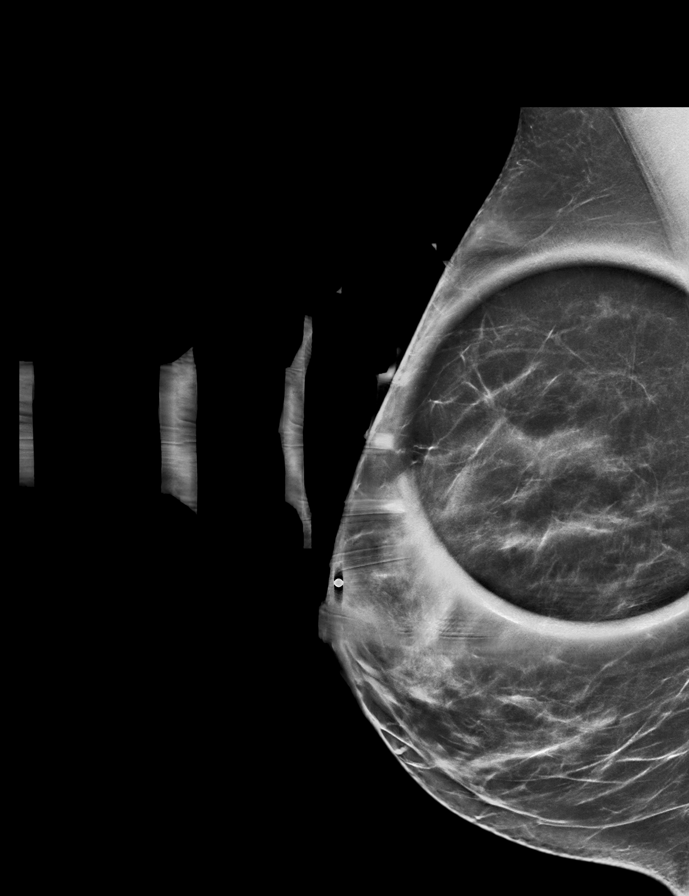

[R MLO synth-2D (3 of 3)]
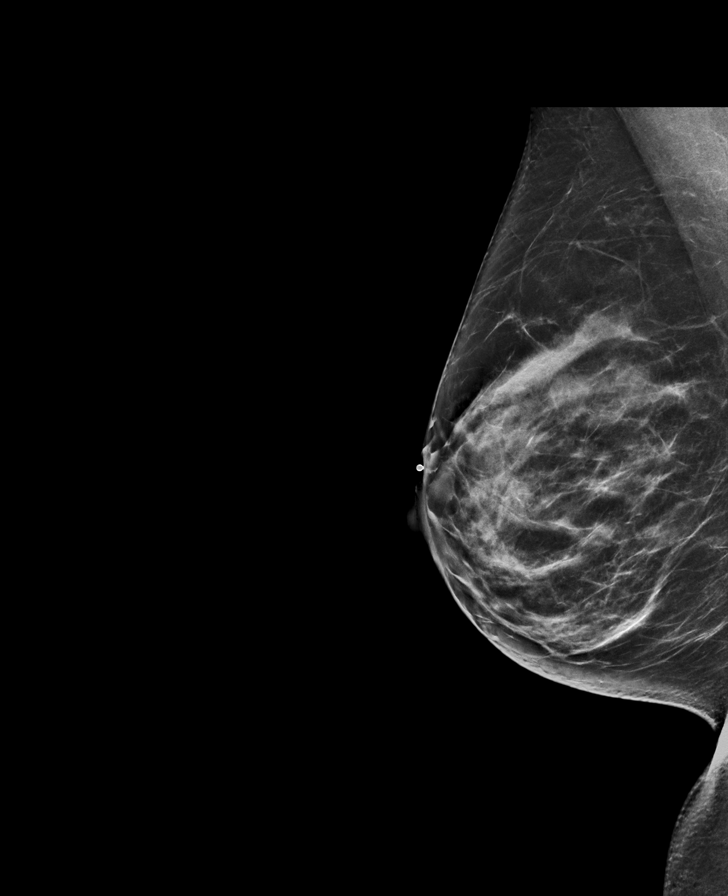

[R CC synth-2D (2 of 2)]
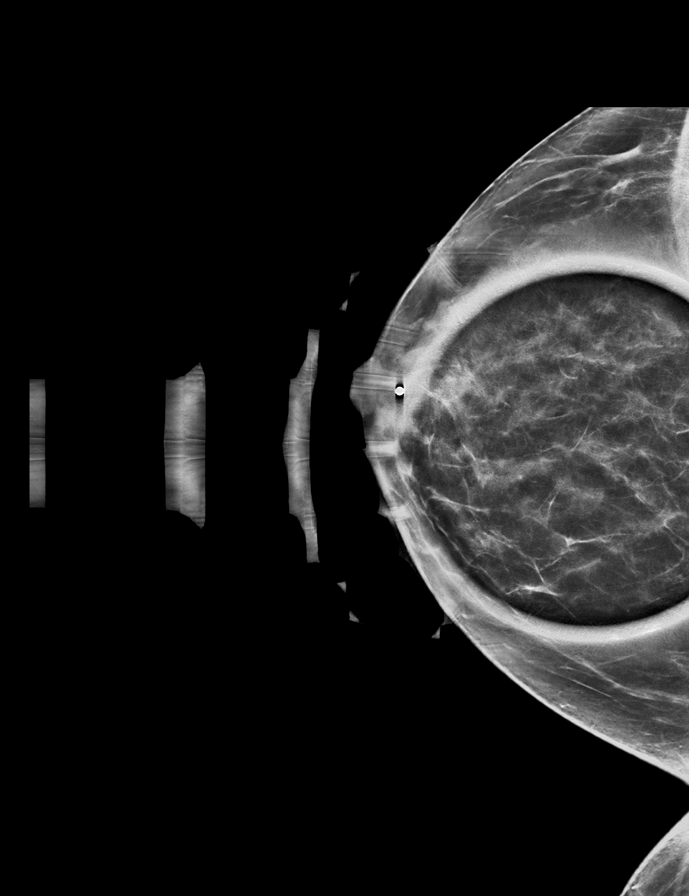

[R MLO tomo · tomo slice 35/68.0]
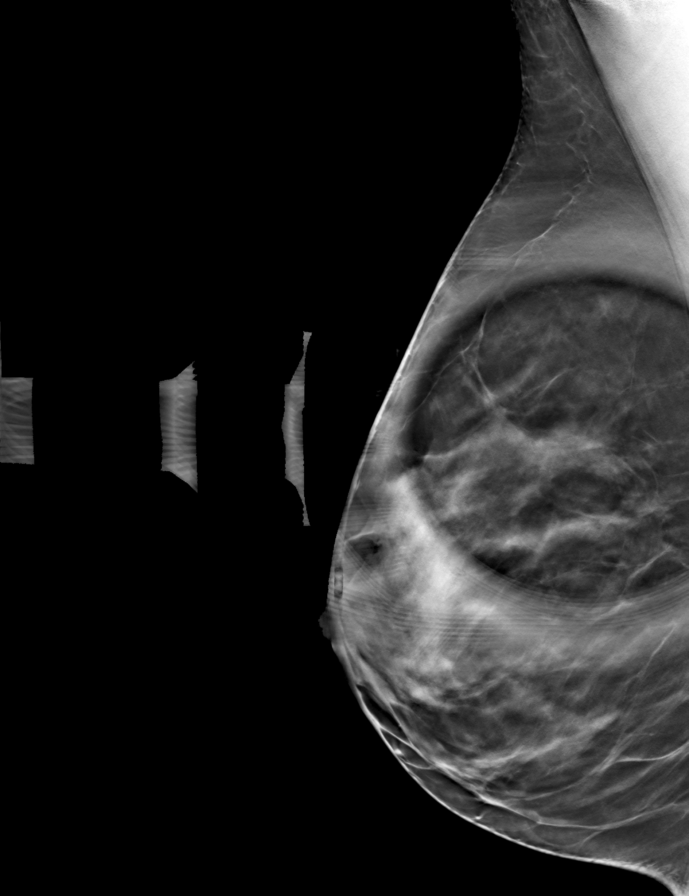

[8 of 40 positions shown; findings below may reference images not displayed]

ACR Breast Density Category c: The breast tissue is heterogeneously
dense, which may obscure small masses.
FINDINGS: There is focal density in the 12 o'clock retroareolar region of the
RIGHT breast in the area of concern to the patient. A circumscribed
mass in the UPPER INNER QUADRANT of the RIGHT breast is confirmed on
spot compression views. LEFT breast is negative.

Mammographic images were processed with CAD.

On physical exam, there is no visible erythema. The appearance of
the nipple is normal. I palpate focal thickening in the 12 o'clock
location of the areola. Patient is not tender on exam today.

Targeted ultrasound is performed, showing an intradermal collection
of fluid/tissue in the 12 o'clock periareolar region. This
collection is 1.5 x 1.2 x 0.5 centimeters.

Within the 1 o'clock location 3 centimeters from the nipple, there
is an area of focal fibrocystic change accounting for the
mammographic finding.
IMPRESSION: 1. Probable residual small abscess in the periareolar region of the
RIGHT breast.
2. Area of fibrocystic changes in the 1 o'clock location of the
RIGHT breast.
3.  No mammographic or ultrasound evidence for malignancy.

RECOMMENDATION:
1. Recommend antibiotics for resolving probable abscess in the 12
o'clock periareolar region of the RIGHT breast.
2. Recommend follow-up RIGHT breast ultrasound in 1 month.
3. If lesion fails to resolve over time, biopsy should be
considered.

I have discussed the findings and recommendations with the patient.
If applicable, a reminder letter will be sent to the patient
regarding the next appointment.

BI-RADS CATEGORY  3: Probably benign.

## 2023-01-12 IMAGING — CR DG CHEST 2V
2 series · 2 of 2 positions shown · non-contrast
Comparison: 08/15/2019

CLINICAL DATA: Nausea and vomiting

EXAM:
CHEST - 2 VIEW

[chest pa]
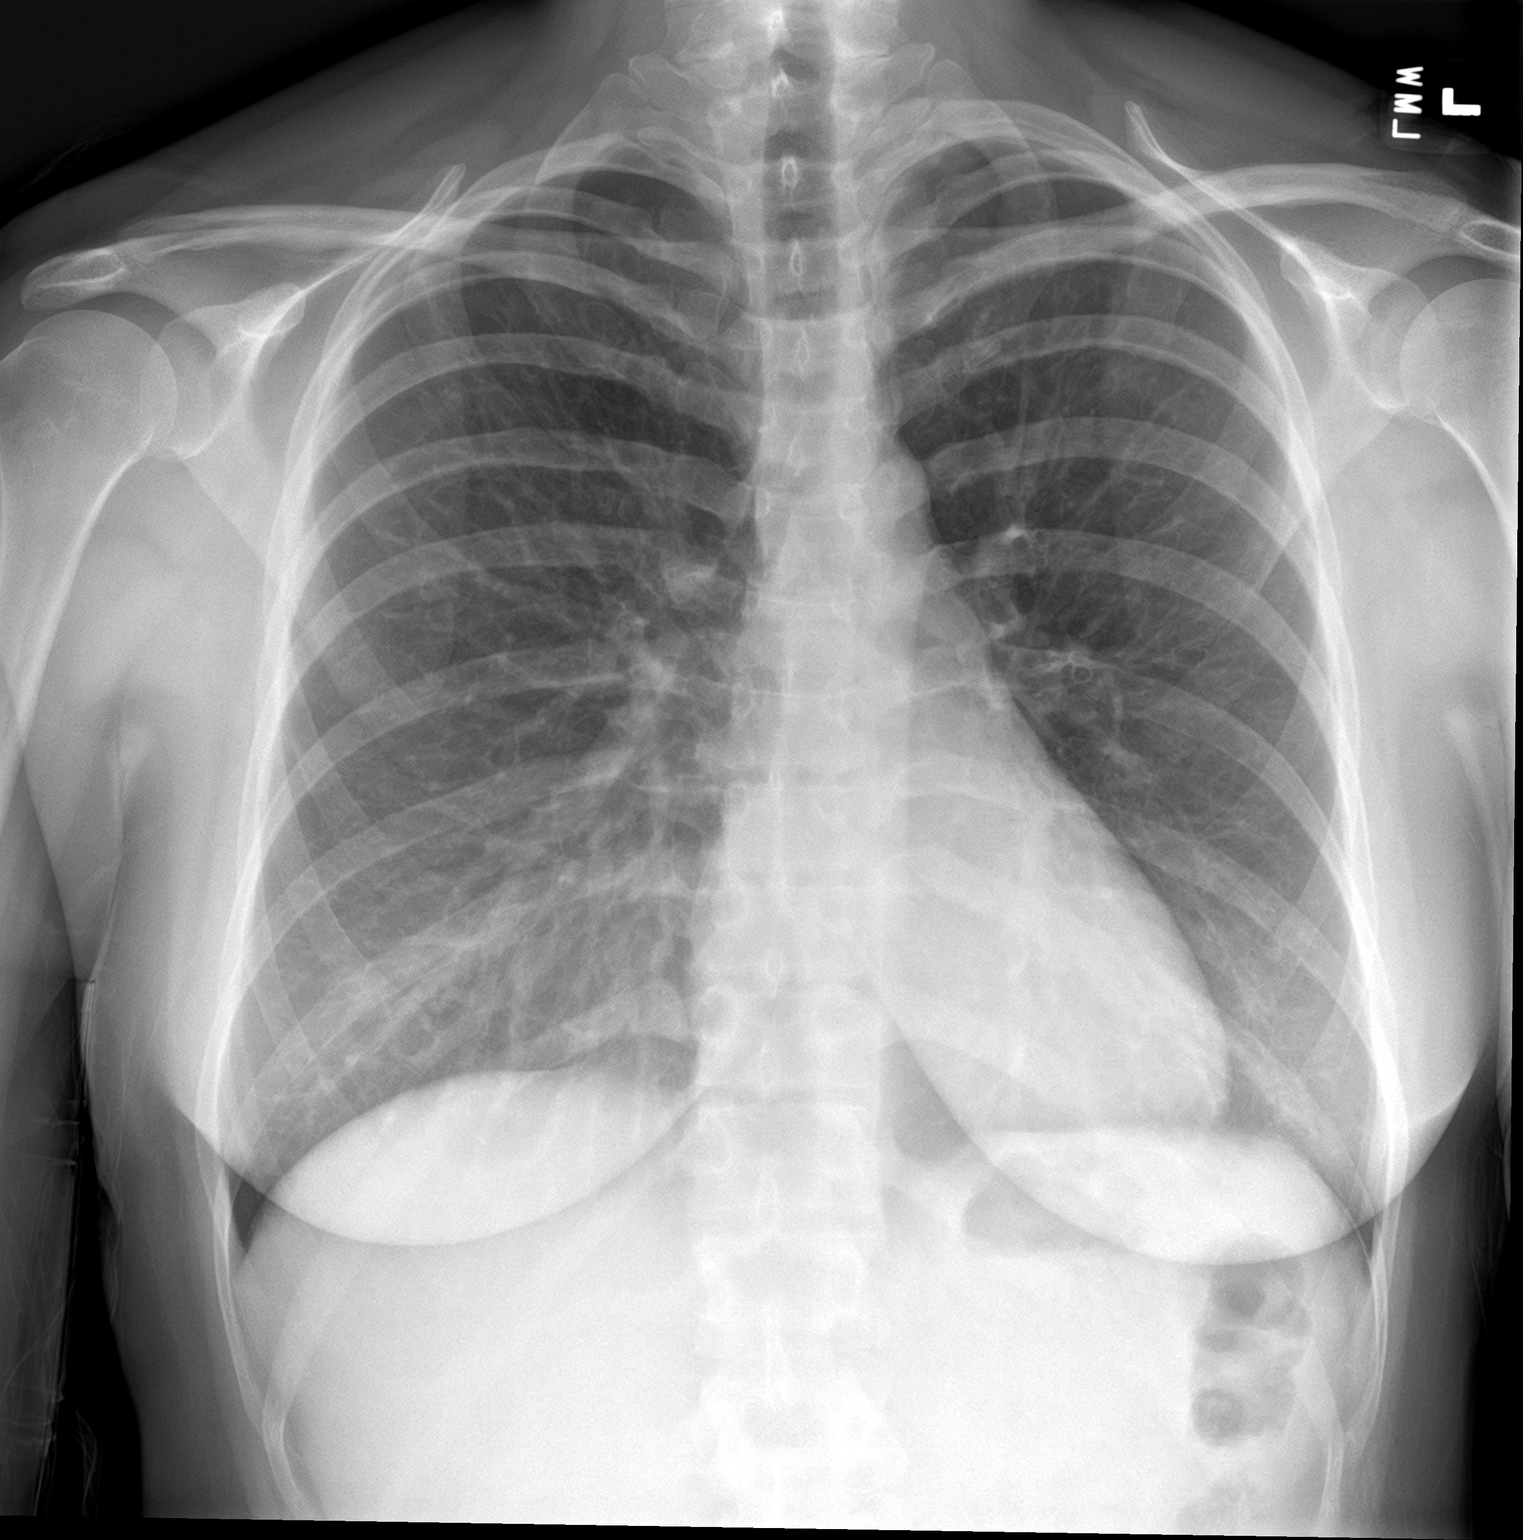

[chest lat]
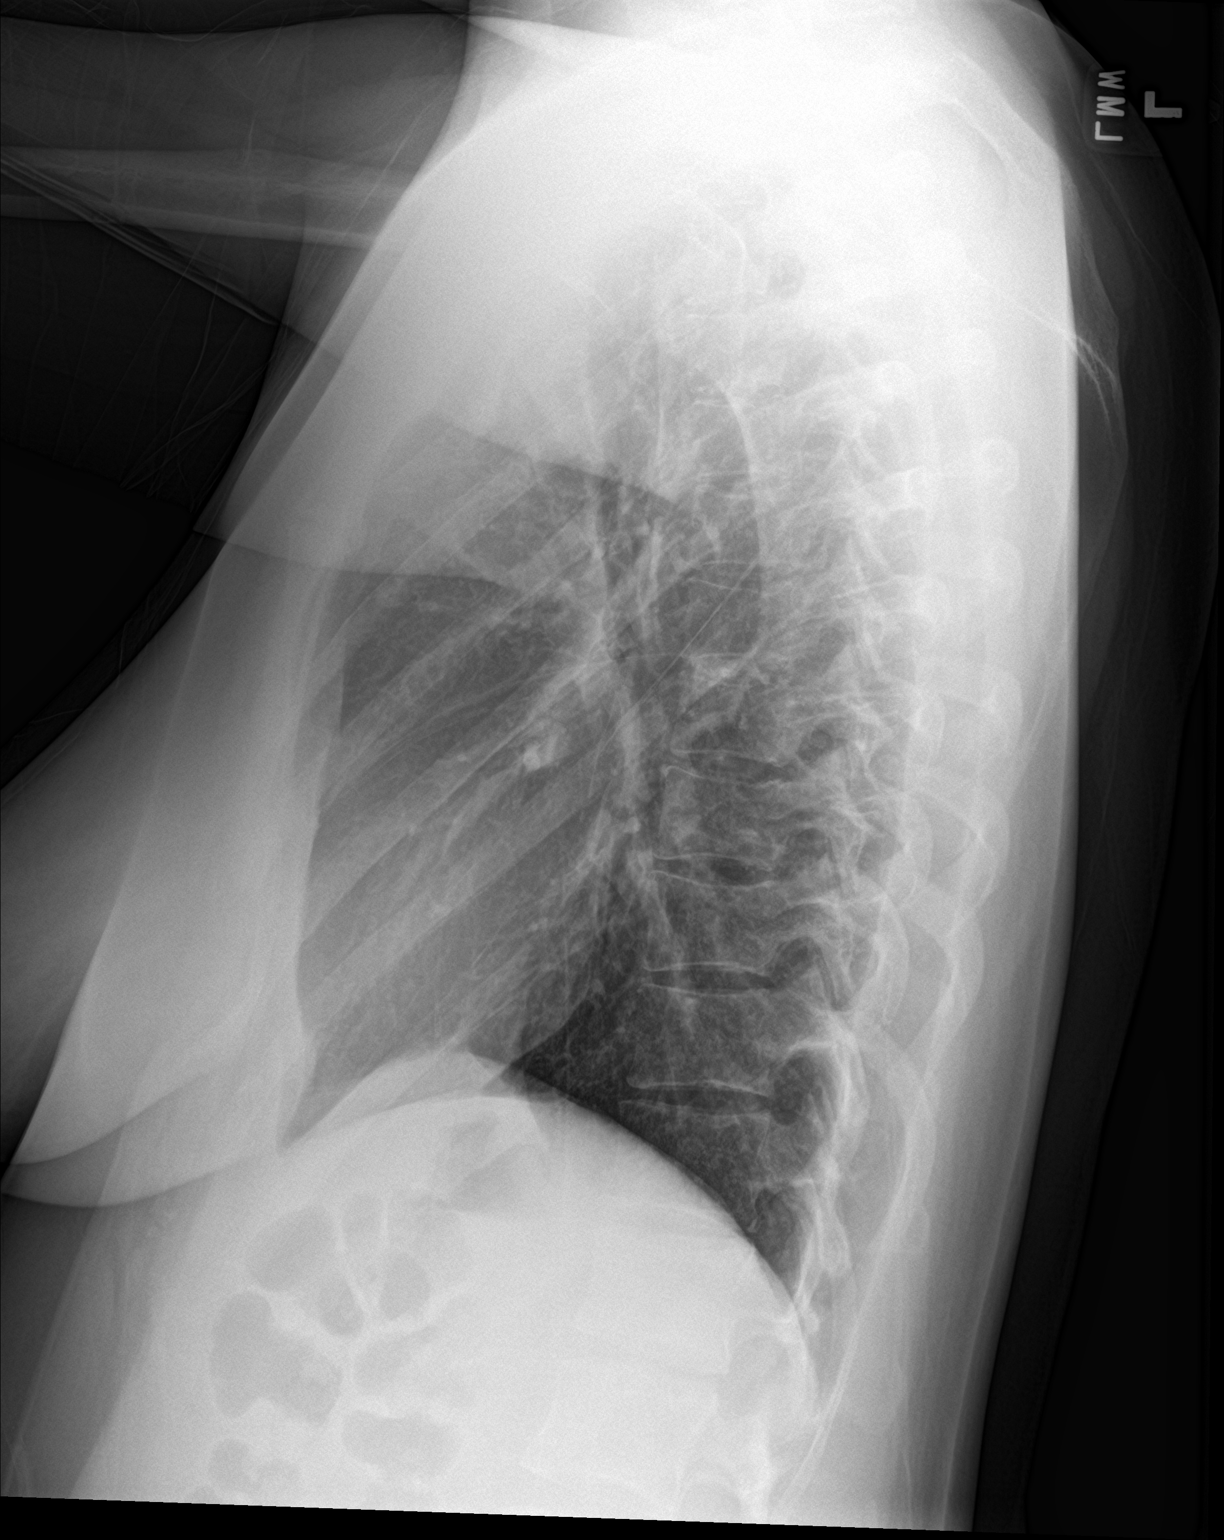

[2 of 2 positions shown; findings below may reference images not displayed]

FINDINGS: The heart size and mediastinal contours are within normal limits.
Both lungs are clear. The visualized skeletal structures are
unremarkable.
IMPRESSION: No active cardiopulmonary disease.

## 2023-05-31 DIAGNOSIS — Z8719 Personal history of other diseases of the digestive system: Secondary | ICD-10-CM | POA: Insufficient documentation

## 2023-05-31 DIAGNOSIS — Z975 Presence of (intrauterine) contraceptive device: Secondary | ICD-10-CM | POA: Insufficient documentation

## 2023-06-04 NOTE — Patient Instructions (Signed)

## 2023-06-05 ENCOUNTER — Encounter: Payer: Self-pay | Admitting: Nurse Practitioner

## 2023-06-05 ENCOUNTER — Ambulatory Visit (INDEPENDENT_AMBULATORY_CARE_PROVIDER_SITE_OTHER): Payer: Managed Care, Other (non HMO) | Admitting: Nurse Practitioner

## 2023-06-05 VITALS — BP 100/68 | HR 64 | Ht 67.0 in | Wt 165.4 lb

## 2023-06-05 DIAGNOSIS — F4310 Post-traumatic stress disorder, unspecified: Secondary | ICD-10-CM

## 2023-06-05 DIAGNOSIS — Z13220 Encounter for screening for lipoid disorders: Secondary | ICD-10-CM

## 2023-06-05 DIAGNOSIS — E611 Iron deficiency: Secondary | ICD-10-CM

## 2023-06-05 DIAGNOSIS — F321 Major depressive disorder, single episode, moderate: Secondary | ICD-10-CM | POA: Diagnosis not present

## 2023-06-05 DIAGNOSIS — Z1159 Encounter for screening for other viral diseases: Secondary | ICD-10-CM | POA: Diagnosis not present

## 2023-06-05 DIAGNOSIS — F1011 Alcohol abuse, in remission: Secondary | ICD-10-CM

## 2023-06-05 DIAGNOSIS — E559 Vitamin D deficiency, unspecified: Secondary | ICD-10-CM

## 2023-06-05 DIAGNOSIS — G43E09 Chronic migraine with aura, not intractable, without status migrainosus: Secondary | ICD-10-CM | POA: Diagnosis not present

## 2023-06-05 DIAGNOSIS — Z136 Encounter for screening for cardiovascular disorders: Secondary | ICD-10-CM | POA: Diagnosis not present

## 2023-06-05 MED ORDER — GABAPENTIN 100 MG PO CAPS: 100.0000 mg | ORAL_CAPSULE | Freq: Every day | ORAL | 1 refills | Status: DC

## 2023-06-05 MED ORDER — RIZATRIPTAN BENZOATE 5 MG PO TABS: 5.0000 mg | ORAL_TABLET | ORAL | 2 refills | Status: DC | PRN

## 2023-06-05 NOTE — Assessment & Plan Note (Addendum)
Has been sober 1 1/2 years, praised for this.  Recommend continued cessation.  Monitor closely.  Strong family history of alcohol abuse.

## 2023-06-05 NOTE — Assessment & Plan Note (Signed)
Chronic, ongoing.  Continue supplement and check Vitamin D level today.

## 2023-06-05 NOTE — Assessment & Plan Note (Signed)
Chronic, ongoing.  Refer to depression plan of care.

## 2023-06-05 NOTE — Assessment & Plan Note (Signed)
Chronic, ongoing.  Followed by psychiatry.  Denies SI/HI today.  Will continue collaboration with psychiatry and therapy + regimen as ordered by them.

## 2023-06-05 NOTE — Assessment & Plan Note (Signed)
Chronic, is taking supplement at this time.  Labs in July showed low levels.  Continue supplement.  Will recheck labs today.

## 2023-06-05 NOTE — Progress Notes (Signed)
New Patient Office Visit  Subjective    Patient ID: Gail Mullins, female    DOB: 07-11-1986  Age: 37 y.o. MRN: [id number]  CC:  Chief Complaint  Patient presents with   Establish Care    Patient says she has an appointment with a Urologist in October for Urinary Issues. Patient says she does not get UTI's but she has issues with urinary frequency.    Headache    Patient says she has recurrent headaches/migraines. Patient says get them at least once a week. Patient says she does not taking anything as nothing works to help. Patient says she sometimes takes Excedrin and Advil to help.    Referral    Patient would like a referral to new OB-GYN.     HPI Gail Mullins presents for new patient visit to establish care.  Introduced to Publishing rights manager role and practice setting.  All questions answered.  Discussed provider/patient relationship and expectations.  Is going to urology in October at [facility] for urinary frequency  Has IUD in place (summer of 2022) -- in need of pap.    ANEMIA Noted on labs and is taking supplement. History of esophageal tear in 2022.  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   Drank heavily for 8-10 years.  Mother and father both drink heavily. Anemia status: stable Etiology of anemia: iron Duration of anemia treatment:  Compliance with treatment: good compliance Iron supplementation side effects: no Severity of anemia: moderate Fatigue: mid afternoon Decreased exercise tolerance: occasionally  Dyspnea on exertion: no Palpitations: no Bleeding: no Pica: no   MIGRAINES Reports having constant headache for one month -- has had headaches ever since she was a teen.  Has never been diagnosed with migraines. No history of head trauma or seizures.  Almost daily migraines at this time.  Has occasional neck pain. Duration: chronic Onset: gradual Severity: 3/10 today and at worst 7/10 Quality:  sharp, dull, aching, throbbing Frequency: intermittent Location: varies in presentation, lately has been front and back -- currently on right hand side Headache duration: 24 hours Radiation: no Time of day headache occurs: varies Alleviating factors: nothing Aggravating factors: nothing Headache status at time of visit: current headache Treatments attempted: Tylenol and Ibuprofen, Excedrin migraine Aura: yes Nausea:  no Vomiting: no Photophobia:  yes Phonophobia:  yes Effect on social functioning:  yes Numbers of missed days of school/work each month: 0 Confusion:   occasional -- gets brain fog  -- this is with and without headaches Gait disturbance/ataxia:  no Behavioral changes:  no -- gets irritable  Fevers:  no   DEPRESSION & PTSD Follows with [facility] for psychiatry and therapy. Started Tr[employer]ell[employer] last week and no current ADR.  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   Mood status: stable Satisfied with current treatment?: yes Symptom severity: moderate  Duration of current treatment : chronic Side effects: no Medication compliance: good compliance Psychotherapy/counseling: yes current Depressed mood: yes Anxious mood: occasionally Anhedonia: yes Significant weight loss or gain: no Insomnia: generally sleeps pretty good Fatigue:  mid afternoon Feelings of worthlessness or guilt: yes Impaired concentration/indecisiveness: yes Suicidal ideations: no Hopelessness: yes Crying spells: yes    06/05/2023    9:53 AM  Depression screen PHQ 2/9  Decreased Interest 3  Down, Depressed, Hopeless 2  PHQ - 2 Score 5  Altered sleeping 2  Tired, decreased energy 3  Change in appetite 3  Feeling bad or failure  about yourself  3  Trouble concentrating 2  Moving slowly or fidgety/restless 2  Suicidal thoughts 1  PHQ-9 Score 21  Difficult doing work/chores Very difficult       06/05/2023    9:54 AM  GAD 7 : Generalized Anxiety Score  Nervous, Anxious, on Edge 2  Control/stop worrying 1   Worry too much - different things 2  Trouble relaxing 3  Restless 2  Easily annoyed or irritable 1  Afraid - awful might happen 2  Total GAD 7 Score 13  Anxiety Difficulty Somewhat difficult    Outpatient Encounter Medications as of 06/05/2023  Medication Sig   gabapentin (NEURONTIN) 100 MG capsule Take 1 capsule (100 mg total) by mouth at bedtime.   OVER THE COUNTER MEDICATION Take 1 tablet by mouth daily. Blood Builder   rizatriptan (MAXALT) 5 MG tablet Take 1 tablet (5 mg total) by mouth as needed for migraine. May repeat in 2 hours if needed   vortioxetine HBr (TRINTELLIX) 5 MG TABS tablet    VRAYLAR capsule Take 1.5 mg by mouth daily.   [DISCONTINUED] acetaminophen (TYLENOL) 325 MG tablet Take 2 tablets (650 mg total) by mouth every 4 (four) hours as needed (for pain scale < 4). (Patient not taking: Reported on 06/05/2023)   [DISCONTINUED] benzocaine-Menthol (DERMOPLAST) 20-0.5 % AERO Apply 1 application topically as needed for irritation (perineal discomfort).   [DISCONTINUED] coconut oil OIL Apply 1 application topically as needed.   [DISCONTINUED] diphenhydrAMINE (BENADRYL) 25 mg capsule Take 1 capsule (25 mg total) by mouth every 6 (six) hours as needed for itching.   [DISCONTINUED] docusate sodium (COLACE) 100 MG capsule Take 1 capsule (100 mg total) by mouth 2 (two) times daily.   [DISCONTINUED] ferrous sulfate 325 (65 FE) MG tablet Take 1 tablet (325 mg total) by mouth 2 (two) times daily with a meal.   [DISCONTINUED] FLUoxetine (PROZAC) 40 MG capsule Take 40 mg by mouth daily. (Patient not taking: Reported on 06/05/2023)   [DISCONTINUED] ibuprofen (ADVIL) 600 MG tablet Take 1 tablet (600 mg total) by mouth every 6 (six) hours. (Patient not taking: Reported on 06/05/2023)   [DISCONTINUED] nicotine (NICODERM CQ - DOSED IN MG/24 HOURS) 21 mg/24hr patch Place 1 patch (21 mg total) onto the skin daily.   [DISCONTINUED] ondansetron (ZOFRAN ODT) 4 MG disintegrating tablet Take 1 tablet  (4 mg total) by mouth every 8 (eight) hours as needed.   [DISCONTINUED] pantoprazole (PROTONIX) 40 MG tablet Take 1 tablet (40 mg total) by mouth 2 (two) times daily.   [DISCONTINUED] prenatal vitamin w/FE, FA (PRENATAL 1 + 1) 27-1 MG TABS tablet Take 1 tablet by mouth daily at 12 noon. (Patient not taking: Reported on 06/05/2023)   [DISCONTINUED] simethicone (MYLICON) 80 MG chewable tablet Chew 1 tablet (80 mg total) by mouth as needed for flatulence.   [DISCONTINUED] witch hazel-glycerin (TUCKS) pad Apply 1 application topically as needed for hemorrhoids.   No facility-administered encounter medications on file as of 06/05/2023.    Past Medical History:  Diagnosis Date   Alcohol abuse    Anxiety    Depression     Past Surgical History:  Procedure Laterality Date   ESOPHAGOGASTRODUODENOSCOPY N/A 08/15/2019   Procedure: ESOPHAGOGASTRODUODENOSCOPY (EGD);  Surgeon: Toledo, Boykin Nearing, MD;  Location: [facility] ENDOSCOPY;  Service: Gastroenterology;  Laterality: N/A;    Family History  Problem Relation Age of Onset   Breast cancer Neg Hx     Social History   Socioeconomic History   Marital status:  Married    Spouse name: Alycia Rossetti   Number of children: Not on file   Years of education: Not on file   Highest education level: Not on file  Occupational History   Not on file  Tobacco Use   Smoking status: Every Day    Current packs/day: 0.50    Average packs/day: 0.5 packs/day for 15.0 years (7.5 ttl pk-yrs)    Types: Cigarettes   Smokeless tobacco: Never  Substance and Sexual Activity   Alcohol use: Not Currently   Drug use: Not Currently   Sexual activity: Yes  Other Topics Concern   Not on file  Social History Narrative   Not on file   Social Determinants of Health   Financial Resource Strain: Not on file  Food Insecurity: Not on file  Transportation Needs: Not on file  Physical Activity: Not on file  Stress: Not on file  Social Connections: Not on file  Intimate Partner  Violence: Not on file    Review of Systems  Constitutional:  Negative for chills, diaphoresis, fever and weight loss.  Respiratory:  Negative for cough, shortness of breath and wheezing.   Cardiovascular:  Negative for chest pain, palpitations, orthopnea and leg swelling.  Gastrointestinal: Negative.   Neurological:  Positive for headaches. Negative for dizziness, speech change, seizures, loss of consciousness and weakness.  Psychiatric/Behavioral:  Positive for depression. Negative for memory loss and suicidal ideas. The patient is nervous/anxious. The patient does not have insomnia.       Objective    BP 100/68   Pulse 64   Ht 5\' 7"  (1.702 m)   Wt 165 lb 6.4 oz (75 kg)   SpO2 98%   Breastfeeding No   BMI 25.91 kg/m   Physical Exam Vitals and nursing note reviewed. Exam conducted with a chaperone present.  Constitutional:      General: She is awake. She is not in acute distress.    Appearance: She is well-developed and well-groomed. She is not ill-appearing or toxic-appearing.  HENT:     Head: Normocephalic and atraumatic.     Right Ear: Hearing and external ear normal. No drainage.     Left Ear: Hearing and external ear normal. No drainage.     Nose: Nose normal.     Right Sinus: No maxillary sinus tenderness or frontal sinus tenderness.     Left Sinus: No maxillary sinus tenderness or frontal sinus tenderness.     Mouth/Throat:     Mouth: Mucous membranes are moist.     Pharynx: Oropharynx is clear. Uvula midline. No pharyngeal swelling, oropharyngeal exudate or posterior oropharyngeal erythema.  Eyes:     General: Lids are normal.        Right eye: No discharge.        Left eye: No discharge.     Extraocular Movements: Extraocular movements intact.     Conjunctiva/sclera: Conjunctivae normal.     Pupils: Pupils are equal, round, and reactive to light.     Visual Fields: Right eye visual fields normal and left eye visual fields normal.  Neck:     Thyroid: No  thyromegaly.     Vascular: No carotid bruit.     Trachea: Trachea normal.  Cardiovascular:     Rate and Rhythm: Normal rate and regular rhythm.     Heart sounds: Normal heart sounds. No murmur heard.    No gallop.  Pulmonary:     Effort: Pulmonary effort is normal. No accessory muscle usage or respiratory  distress.     Breath sounds: Normal breath sounds.  Chest:  Breasts:    Right: Normal.     Left: Normal.  Abdominal:     General: Bowel sounds are normal. There is no distension.     Palpations: Abdomen is soft.     Tenderness: There is no abdominal tenderness.  Musculoskeletal:        General: Normal range of motion.     Cervical back: Normal range of motion and neck supple.     Right lower leg: No edema.     Left lower leg: No edema.  Lymphadenopathy:     Head:     Right side of head: No submental, submandibular, tonsillar, preauricular or posterior auricular adenopathy.     Left side of head: No submental, submandibular, tonsillar, preauricular or posterior auricular adenopathy.     Cervical: No cervical adenopathy.     Upper Body:     Right upper body: No supraclavicular, axillary or pectoral adenopathy.     Left upper body: No supraclavicular, axillary or pectoral adenopathy.  Skin:    General: Skin is warm and dry.     Capillary Refill: Capillary refill takes less than 2 seconds.     Findings: No rash.  Neurological:     Mental Status: She is alert and oriented to person, place, and time.     Cranial Nerves: Cranial nerves 2-12 are intact.     Sensory: Sensation is intact.     Motor: Motor function is intact.     Coordination: Coordination is intact.     Gait: Gait is intact.     Deep Tendon Reflexes: Reflexes are normal and symmetric.     Reflex Scores:      Brachioradialis reflexes are 2+ on the right side and 2+ on the left side.      Patellar reflexes are 2+ on the right side and 2+ on the left side. Psychiatric:        Attention and Perception: Attention  normal.        Mood and Affect: Mood normal.        Speech: Speech normal.        Behavior: Behavior normal. Behavior is cooperative.        Thought Content: Thought content normal.     Last CBC Lab Results  Component Value Date   WBC 15.3 (H) 04/17/2021   HGB 12.2 04/18/2021   HCT 37.2 04/18/2021   MCV 89.4 04/17/2021   MCH 29.1 04/17/2021   RDW 14.5 04/17/2021   PLT 320 04/17/2021   Last metabolic panel Lab Results  Component Value Date   GLUCOSE 108 (H) 04/17/2021   NA 141 04/17/2021   K 4.2 04/17/2021   CL 112 (H) 04/17/2021   CO2 20 (L) 04/17/2021   BUN 9 04/17/2021   CREATININE 0.66 04/17/2021   GFRNONAA >60 04/17/2021   CALCIUM 8.8 (L) 04/17/2021   PHOS 2.2 (L) 08/17/2019   PROT 7.7 04/17/2021   ALBUMIN 4.5 04/17/2021   BILITOT 0.4 04/17/2021   ALKPHOS 69 04/17/2021   AST 25 04/17/2021   ALT 24 04/17/2021   ANIONGAP 9 04/17/2021      Assessment & Plan:   Problem List Items Addressed This Visit       Cardiovascular and Mediastinum   Chronic migraine with aura without status migrainosus, not intractable - Primary    Chronic, ongoing since teen years with worsening recently.  No neuro red flags on exam.  Educated her on medications used in migraine prevention and for acute.  Will start Gabapentin 100 MG to start at night, which may benefit migraines and mood + sleep, educated her on this medication and side effects.  Maxalt sent for acute episodes, educated her on how to use this and side effects.  Will have return in 4 weeks for recheck.  May need imaging in future if ongoing daily headaches.      Relevant Medications   vortioxetine HBr (TRINTELLIX) 5 MG TABS tablet   gabapentin (NEURONTIN) 100 MG capsule   rizatriptan (MAXALT) 5 MG tablet   Other Relevant Orders   Comprehensive metabolic panel   TSH     Other   Depression, major, single episode, moderate (HCC)    Chronic, ongoing.  Followed by psychiatry.  Denies SI/HI today.  Will continue  collaboration with psychiatry and therapy + regimen as ordered by them.        Relevant Medications   vortioxetine HBr (TRINTELLIX) 5 MG TABS tablet   History of alcohol abuse    Has been sober 1 1/2 years, praised for this.  Recommend continued cessation.  Monitor closely.  Strong family history of alcohol abuse.      Iron deficiency    Chronic, is taking supplement at this time.  Labs in July showed low levels.  Continue supplement.  Will recheck labs today.      Relevant Orders   CBC with Differential/Platelet   Iron Binding Cap (TIBC)([facility])   PTSD (post-traumatic stress disorder)    Chronic, ongoing.  Refer to depression plan of care.      Relevant Medications   vortioxetine HBr (TRINTELLIX) 5 MG TABS tablet   Vitamin D deficiency    Chronic, ongoing.  Continue supplement and check Vitamin D level today.      Relevant Orders   VITAMIN D 25 Hydroxy (Vit-D Deficiency, Fractures)   Other Visit Diagnoses     Encounter for lipid screening for cardiovascular disease       Lipid panel on labs today.   Relevant Orders   Comprehensive metabolic panel   Lipid Panel w/o Chol/HDL Ratio   Need for hepatitis C screening test       Hep C screening on labs today, discussed with patient.   Relevant Orders   Hepatitis C antibody       Return in about 4 weeks (around 07/03/2023) for MIGRAINES & PAP.   Marjie Skiff, NP

## 2023-06-05 NOTE — Assessment & Plan Note (Signed)
Chronic, ongoing since teen years with worsening recently.  No neuro red flags on exam.  Educated her on medications used in migraine prevention and for acute.  Will start Gabapentin 100 MG to start at night, which may benefit migraines and mood + sleep, educated her on this medication and side effects.  Maxalt sent for acute episodes, educated her on how to use this and side effects.  Will have return in 4 weeks for recheck.  May need imaging in future if ongoing daily headaches.

## 2023-06-06 LAB — COMPREHENSIVE METABOLIC PANEL
ALT: 11 IU/L (ref 0–32)
AST: 14 IU/L (ref 0–40)
Albumin: 4.6 g/dL (ref 3.9–4.9)
Alkaline Phosphatase: 81 IU/L (ref 44–121)
BUN/Creatinine Ratio: 13 (ref 9–23)
BUN: 9 mg/dL (ref 6–20)
Bilirubin Total: 0.2 mg/dL (ref 0.0–1.2)
CO2: 22 mmol/L (ref 20–29)
Calcium: 9.7 mg/dL (ref 8.7–10.2)
Chloride: 102 mmol/L (ref 96–106)
Creatinine, Ser: 0.68 mg/dL (ref 0.57–1.00)
Globulin, Total: 2.8 g/dL (ref 1.5–4.5)
Glucose: 76 mg/dL (ref 70–99)
Potassium: 4.4 mmol/L (ref 3.5–5.2)
Sodium: 139 mmol/L (ref 134–144)
Total Protein: 7.4 g/dL (ref 6.0–8.5)
eGFR: 115 mL/min/{1.73_m2} (ref >59)

## 2023-06-06 LAB — CBC WITH DIFFERENTIAL/PLATELET
Basophils Absolute: 0 10*3/uL (ref 0.0–0.2)
Basos: 1 %
EOS (ABSOLUTE): 0.4 10*3/uL (ref 0.0–0.4)
Eos: 5 %
Hematocrit: 37.2 % (ref 34.0–46.6)
Hemoglobin: 11.4 g/dL (ref 11.1–15.9)
Immature Grans (Abs): 0 10*3/uL (ref 0.0–0.1)
Immature Granulocytes: 0 %
Lymphocytes Absolute: 2 10*3/uL (ref 0.7–3.1)
Lymphs: 27 %
MCH: 27 pg (ref 26.6–33.0)
MCHC: 30.6 g/dL — ABNORMAL LOW (ref 31.5–35.7)
MCV: 88 fL (ref 79–97)
Monocytes Absolute: 0.3 10*3/uL (ref 0.1–0.9)
Monocytes: 4 %
Neutrophils Absolute: 4.5 10*3/uL (ref 1.4–7.0)
Neutrophils: 63 %
Platelets: 309 10*3/uL (ref 150–450)
RBC: 4.22 x10E6/uL (ref 3.77–5.28)
RDW: 17.1 % — ABNORMAL HIGH (ref 11.7–15.4)
WBC: 7.2 10*3/uL (ref 3.4–10.8)

## 2023-06-06 LAB — LIPID PANEL W/O CHOL/HDL RATIO
Cholesterol, Total: 254 mg/dL — ABNORMAL HIGH (ref 100–199)
HDL: 51 mg/dL (ref >39)
LDL Chol Calc (NIH): 181 mg/dL — ABNORMAL HIGH (ref 0–99)
Triglycerides: 120 mg/dL (ref 0–149)
VLDL Cholesterol Cal: 22 mg/dL (ref 5–40)

## 2023-06-06 LAB — IRON AND TIBC
Iron Saturation: 18 % (ref 15–55)
Iron: 53 ug/dL (ref 27–159)
Total Iron Binding Capacity: 299 ug/dL (ref 250–450)
UIBC: 246 ug/dL (ref 131–425)

## 2023-06-06 LAB — VITAMIN D 25 HYDROXY (VIT D DEFICIENCY, FRACTURES): Vit D, 25-Hydroxy: 37.7 ng/mL (ref 30.0–100.0)

## 2023-06-06 LAB — HEPATITIS C ANTIBODY: Hep C Virus Ab: NONREACTIVE

## 2023-06-06 LAB — TSH: TSH: 1.91 u[IU]/mL (ref 0.450–4.500)

## 2023-06-06 NOTE — Progress Notes (Signed)
Contacted via MyChart   Good evening Aroosh, it was wonderful to meet you.  Your labs have returned and these overall look great, iron level improving.  Continue your supplement.  Kidney function, creatinine and eGFR, remains normal, as is liver function, AST and ALT. Lipid panel is showing some elevations.  Your cholesterol is high, but recommendations to make lifestyle changes. Your LDL is above normal. The LDL is the bad cholesterol. Over time and in combination with inflammation and other factors, this contributes to plaque which in turn may lead to stroke and/or heart attack down the road. Sometimes high LDL is primarily genetic, and people might be eating all the right foods but still have high numbers. Other times, there is room for improvement in one's diet and eating healthier can bring this number down and potentially reduce one's risk of heart attack and/or stroke.   To reduce your LDL, Remember - more fruits and vegetables, more fish, and limit red meat and dairy products. More soy, nuts, beans, barley, lentils, oats and plant sterol ester enriched margarine instead of butter. I also encourage eliminating sugar and processed food. Remember, shop on the outside of the grocery store and visit your [employer]. Any questions? Keep being amazing!!  Thank you for allowing me to participate in your care.  I appreciate you. Kindest regards, Krystian Younglove

## 2023-06-12 ENCOUNTER — Encounter: Payer: Self-pay | Admitting: Nurse Practitioner

## 2023-06-12 MED ORDER — SUMATRIPTAN SUCCINATE 50 MG PO TABS: 50.0000 mg | ORAL_TABLET | ORAL | 0 refills | Status: DC | PRN

## 2023-06-29 DIAGNOSIS — E7800 Pure hypercholesterolemia, unspecified: Secondary | ICD-10-CM | POA: Insufficient documentation

## 2023-06-29 NOTE — Patient Instructions (Signed)
Be Involved in Caring For Your Health:  Taking Medications When medications are taken as directed, they can greatly improve your health. But if they are not taken as prescribed, they may not work. In some cases, not taking them correctly can be harmful. To help ensure your treatment remains effective and safe, understand your medications and how to take them. Bring your medications to each visit for review by your provider.  Your lab results, notes, and after visit summary will be available on My Chart. We strongly encourage you to use this feature. If lab results are abnormal the clinic will contact you with the appropriate steps. If the clinic does not contact you assume the results are satisfactory. You can always view your results on My Chart. If you have questions regarding your health or results, please contact the clinic during office hours. You can also ask questions on My Chart.  We at [facility] are grateful that you chose [place] to provide your care. We strive to provide evidence-based and compassionate care and are always looking for feedback. If you get a survey from the clinic please complete this so we can hear your opinions.  Migraine Headache A migraine headache is a very strong throbbing pain on one or both sides of your head. This type of headache can also cause other symptoms. It can last from 4 hours to 3 days. Talk with your doctor about what things may bring on (trigger) this condition. What are the causes? The exact cause of a migraine is not known. This condition may be brought on or caused by: Smoking. Medicines, such as: Medicine used to treat chest pain (nitroglycerin). Birth control pills. Estrogen. Some blood pressure medicines. Certain substances in some foods or drinks. Foods and drinks, such as: Cheese. Chocolate. Alcohol. Caffeine. Doing physical activity that is very hard. Other things that may trigger a migraine headache  include: Periods. Pregnancy. Hunger. Stress. Getting too much or too little sleep. Weather changes. Feeling tired (fatigue). What increases the risk? Being 14-52 years old. Being female. Having a family history of migraine headaches. Being Caucasian. Having a mental health condition, such as being sad (depressed) or feeling worried or nervous (anxious). Being very overweight (obese). What are the signs or symptoms? A throbbing pain. This pain may: Happen in any area of the head, such as on one or both sides. Make it hard to do daily activities. Get worse with physical activity. Get worse around bright lights, loud noises, or smells. Other symptoms may include: Feeling like you may vomit (nauseous). Vomiting. Dizziness. Before a migraine headache starts, you may get warning signs (an aura). An aura may include: Seeing flashing lights or having blind spots. Seeing bright spots, halos, or zigzag lines. Having tunnel vision or blurred vision. Having numbness or a tingling feeling. Having trouble talking. Having weak muscles. After a migraine ends, you may have symptoms. These may include: Tiredness. Trouble thinking (concentrating). How is this treated? Taking medicines that: Relieve pain. Relieve the feeling like you may vomit. Prevent migraine headaches. Treatment may also include: Acupuncture. Lifestyle changes like avoiding foods that bring on migraine headaches. Learning ways to control your body functions (biofeedback). Therapy to help you know and deal with negative thoughts (cognitive behavioral therapy). Follow these instructions at home: Medicines Take over-the-counter and prescription medicines only as told by your doctor. If told, take steps to prevent problems with pooping (constipation). You may need to: Drink enough fluid to keep your pee (urine) pale yellow. Take  medicines. You will be told what medicines to take. Eat foods that are high in fiber. These  include beans, whole grains, and fresh fruits and vegetables. Limit foods that are high in fat and sugar. These include fried or sweet foods. Ask your doctor if you should avoid driving or using machines while you are taking your medicine. Lifestyle  Do not drink alcohol. Do not smoke or use any products that contain nicotine or tobacco. If you need help quitting, ask your doctor. Get 7-9 hours of sleep each night, or the amount recommended by your doctor. Find ways to deal with stress, such as meditation, deep breathing, or yoga. Try to exercise often. This can help lessen how bad and how often your migraines happen. General instructions Keep a journal to find out what may bring on your migraine headaches. This can help you avoid those things. For example, write down: What you eat and drink. How much sleep you get. Any change to your medicines or diet. If you have a migraine headache: Avoid things that make your symptoms worse, such as bright lights. Lie down in a dark, quiet room. Do not drive or use machinery. Ask your doctor what activities are safe for you. Where to find more information Coalition for Headache and Migraine Patients (CHAMP): headachemigraine.org American Migraine Foundation: americanmigrainefoundation.org National Headache Foundation: headaches.org Contact a doctor if: You get a migraine headache that is different or worse than others you have had. You have more than 15 days of headaches in one month. Get help right away if: Your migraine headache gets very bad. Your migraine headache lasts more than 72 hours. You have a fever or stiff neck. You have trouble seeing. Your muscles feel weak or like you cannot control them. You lose your balance a lot. You have trouble walking. You faint. You have a seizure. This information is not intended to replace advice given to you by your health care provider. Make sure you discuss any questions you have with your health  care provider. Document Revised: 05/20/2022 Document Reviewed: 05/20/2022 Elsevier Patient Education  2024 [employer].

## 2023-07-04 ENCOUNTER — Ambulatory Visit (INDEPENDENT_AMBULATORY_CARE_PROVIDER_SITE_OTHER): Payer: Managed Care, Other (non HMO) | Admitting: Nurse Practitioner

## 2023-07-04 ENCOUNTER — Encounter: Payer: Self-pay | Admitting: Nurse Practitioner

## 2023-07-04 VITALS — BP 109/74 | HR 61 | Temp 98.5°F | Ht 67.0 in | Wt 165.0 lb

## 2023-07-04 DIAGNOSIS — G43E09 Chronic migraine with aura, not intractable, without status migrainosus: Secondary | ICD-10-CM | POA: Diagnosis not present

## 2023-07-04 DIAGNOSIS — Z23 Encounter for immunization: Secondary | ICD-10-CM

## 2023-07-04 MED ORDER — GABAPENTIN 600 MG PO TABS: 300.0000 mg | ORAL_TABLET | Freq: Every day | ORAL | 3 refills | Status: DC

## 2023-07-04 NOTE — Assessment & Plan Note (Signed)
Chronic, ongoing since teen years.  Appear to be improving slowly with Gabapentin, has no ADR with this and noting less episodes.  No neuro red flags on exam.  Will increase Gabapentin to 300 MG, which may continue to benefit migraines and mood + sleep, educated her on this medication and side effects.  Imitrex for acute episodes, educated her on how to use this and side effects.  Will have return in 4 weeks for recheck.  May need imaging in future if ongoing daily headaches.

## 2023-07-04 NOTE — Progress Notes (Signed)
BP 109/74   Pulse 61   Temp 98.5 F (36.9 C) (Oral)   Ht 5\' 7"  (1.702 m)   Wt 165 lb (74.8 kg)   SpO2 96%   BMI 25.84 kg/m    Subjective:    Patient ID: Gail Mullins, female    DOB: 1986-06-19, 37 y.o.   MRN: [id number]  HPI: Gail Mullins is a 37 y.o. female  Chief Complaint  Patient presents with   Migraine    Pt still taking ibuprofen and tylenol along with imitrex to relieve Migraines. Pt refused flu shot   MIGRAINES Follow-up today for migraines. Started Gabapentin 100 MG at night and Maxalt, however Maxalt did not work and changes Imitrex.  Denies any ADR.  She reports larger headaches are down to once a week. Having a headache once a week.  Had constant headache for one month which has improved and not as often -- has had headaches ever since she was a teen. Has never been diagnosed with migraines. No history of head trauma or seizures. Has occasional neck pain.  Duration: chronic Onset: gradual Severity: 5/10 Quality: sharp, dull, and aching Frequency: intermittent Location: front of head and radiates back Headache duration: on Monday it lasted a few hours after medication + Wednesday lasted about same amount Radiation: no Time of day headache occurs: varies Alleviating factors: Tylenol and Imitrex, Ibuprofen Aggravating factors: unknown Headache status at time of visit: asymptomatic Treatments attempted: Maxalt, Imitrex, Gabapentin, Tylenol, Ibuprofen   Aura: yes Nausea:  no Vomiting: no Photophobia:  yes Phonophobia:  yes Effect on social functioning:  no Numbers of missed days of school/work each month: none Confusion:  no Gait disturbance/ataxia:  no Behavioral changes:  no Fevers:  no   Relevant past medical, surgical, family and social history reviewed and updated as indicated. Interim medical history since our last visit reviewed. Allergies and medications reviewed and updated.  Review of Systems  Constitutional:  Negative for activity  change, appetite change, diaphoresis, fatigue and fever.  Respiratory:  Negative for cough, chest tightness and shortness of breath.   Cardiovascular:  Negative for chest pain, palpitations and leg swelling.  Neurological:  Positive for headaches. Negative for dizziness, syncope, weakness, light-headedness and numbness.  Psychiatric/Behavioral: Negative.      Per HPI unless specifically indicated above     Objective:    BP 109/74   Pulse 61   Temp 98.5 F (36.9 C) (Oral)   Ht 5\' 7"  (1.702 m)   Wt 165 lb (74.8 kg)   SpO2 96%   BMI 25.84 kg/m   Wt Readings from Last 3 Encounters:  07/04/23 165 lb (74.8 kg)  06/05/23 165 lb 6.4 oz (75 kg)  01/08/21 213 lb (96.6 kg)    Physical Exam Vitals and nursing note reviewed.  Constitutional:      General: She is awake. She is not in acute distress.    Appearance: Normal appearance. She is well-developed and well-groomed. She is not ill-appearing or toxic-appearing.  HENT:     Head: Normocephalic.     Right Ear: Hearing and external ear normal.     Left Ear: Hearing and external ear normal.  Eyes:     General: Lids are normal.        Right eye: No discharge.        Left eye: No discharge.     Conjunctiva/sclera: Conjunctivae normal.     Pupils: Pupils are equal, round, and reactive to light.  Neck:  Thyroid: No thyromegaly.     Vascular: No carotid bruit.  Cardiovascular:     Rate and Rhythm: Normal rate and regular rhythm.     Heart sounds: Normal heart sounds. No murmur heard.    No gallop.  Pulmonary:     Effort: Pulmonary effort is normal. No accessory muscle usage or respiratory distress.     Breath sounds: Normal breath sounds.  Abdominal:     General: Bowel sounds are normal. There is no distension.     Palpations: Abdomen is soft.     Tenderness: There is no abdominal tenderness.  Musculoskeletal:     Cervical back: Normal range of motion and neck supple.     Right lower leg: No edema.     Left lower leg: No  edema.  Lymphadenopathy:     Cervical: No cervical adenopathy.  Skin:    General: Skin is warm and dry.  Neurological:     Mental Status: She is alert and oriented to person, place, and time.     Cranial Nerves: Cranial nerves 2-12 are intact.     Coordination: Coordination is intact.     Gait: Gait is intact.     Deep Tendon Reflexes: Reflexes are normal and symmetric.     Reflex Scores:      Brachioradialis reflexes are 2+ on the right side and 2+ on the left side.      Patellar reflexes are 2+ on the right side and 2+ on the left side. Psychiatric:        Attention and Perception: Attention normal.        Mood and Affect: Mood normal.        Speech: Speech normal.        Behavior: Behavior normal. Behavior is cooperative.        Thought Content: Thought content normal.    Results for orders placed or performed in visit on 07/04/23  HM PAP SMEAR  Result Value Ref Range   HM Pap smear ASCUS with neg HPV       Assessment & Plan:   Problem List Items Addressed This Visit       Cardiovascular and Mediastinum   Chronic migraine with aura without status migrainosus, not intractable - Primary    Chronic, ongoing since teen years.  Appear to be improving slowly with Gabapentin, has no ADR with this and noting less episodes.  No neuro red flags on exam.  Will increase Gabapentin to 300 MG, which may continue to benefit migraines and mood + sleep, educated her on this medication and side effects.  Imitrex for acute episodes, educated her on how to use this and side effects.  Will have return in 4 weeks for recheck.  May need imaging in future if ongoing daily headaches.      Relevant Medications   gabapentin (NEURONTIN) 600 MG tablet     Follow up plan: Return in about 4 weeks (around 08/01/2023) for Migraines.

## 2023-07-07 ENCOUNTER — Other Ambulatory Visit: Payer: Self-pay | Admitting: Nurse Practitioner

## 2023-07-07 ENCOUNTER — Encounter: Payer: Self-pay | Admitting: Urology

## 2023-07-07 ENCOUNTER — Ambulatory Visit (INDEPENDENT_AMBULATORY_CARE_PROVIDER_SITE_OTHER): Payer: Managed Care, Other (non HMO) | Admitting: Urology

## 2023-07-07 VITALS — BP 106/68 | HR 78 | Ht 67.0 in | Wt 169.0 lb

## 2023-07-07 DIAGNOSIS — R32 Unspecified urinary incontinence: Secondary | ICD-10-CM | POA: Diagnosis not present

## 2023-07-07 DIAGNOSIS — N3281 Overactive bladder: Secondary | ICD-10-CM

## 2023-07-07 DIAGNOSIS — N3941 Urge incontinence: Secondary | ICD-10-CM

## 2023-07-07 DIAGNOSIS — R350 Frequency of micturition: Secondary | ICD-10-CM

## 2023-07-07 DIAGNOSIS — R3915 Urgency of urination: Secondary | ICD-10-CM | POA: Diagnosis not present

## 2023-07-07 LAB — URINALYSIS, COMPLETE
Bilirubin, UA: NEGATIVE
Glucose, UA: NEGATIVE
Ketones, UA: NEGATIVE
Nitrite, UA: NEGATIVE
Protein,UA: NEGATIVE
Specific Gravity, UA: 1.01 (ref 1.005–1.030)
Urobilinogen, Ur: 0.2 mg/dL (ref 0.2–1.0)
pH, UA: 5.5 (ref 5.0–7.5)

## 2023-07-07 LAB — MICROSCOPIC EXAMINATION: Bacteria, UA: NONE SEEN

## 2023-07-07 MED ORDER — GEMTESA 75 MG PO TABS: 1.0000 | ORAL_TABLET | Freq: Every day | ORAL | Status: DC

## 2023-07-07 NOTE — Progress Notes (Signed)
07/07/2023 8:35 AM   Linus Salmons 01/09/1986 [id number]  Referring provider: Gildardo Pounds, PA [address] [place],  [place] [address]  No chief complaint on file.   HPI: I was consulted to assess the patient's urgency and frequency.  Her last 3 years she can have sudden urgency especially when she goes from sitting to standing position.  She can leak a small amount but does not wear a pad and is almost daily.  Caffeine reduction helps some.  No stress incontinence or bedwetting  She can void twice in an hour and cannot sit for 2 hours watching movie without needing to urinate.  She gets up once at night.  Flow was poor and she feels empty  No history of bladder surgery kidney stones or bladder infections.  No neurologic issues.  She has not had a hysterectomy.  Bowel movements normal   PMH: Past Medical History:  Diagnosis Date   Alcohol abuse    Anxiety    Depression     Surgical History: Past Surgical History:  Procedure Laterality Date   ESOPHAGOGASTRODUODENOSCOPY N/A 08/15/2019   Procedure: ESOPHAGOGASTRODUODENOSCOPY (EGD);  Surgeon: Toledo, Boykin Nearing, MD;  Location: [facility] END[facility];  Service: Gastroenterology;  Laterality: N/A;    Home Medications:  Allergies as of 07/07/2023   No Known Allergies      Medication List        Accurate as of July 07, 2023  8:35 AM. If you have any questions, ask your nurse or doctor.          gabapentin 600 MG tablet Commonly known as: Neurontin Take 0.5 tablets (300 mg total) by mouth at bedtime.   OVER THE COUNTER MEDICATION Take 1 tablet by mouth daily. Blood Builder   SUMAtriptan 50 MG tablet Commonly known as: Imitrex Take 1 tablet (50 mg total) by mouth every 2 (two) hours as needed for migraine. May repeat in 2 hours if headache persists or recurs.   Trintellix 5 MG Tabs tablet Generic drug: vortioxetine HBr   Vraylar 1.5 MG capsule Generic drug: cariprazine Take 1.5 mg by mouth  daily.        Allergies: No Known Allergies  Family History: Family History  Problem Relation Age of Onset   Breast cancer Neg Hx     Social History:  reports that she has been smoking cigarettes. She has a 7.5 pack-year smoking history. She has never used smokeless tobacco. She reports that she does not currently use alcohol. She reports that she does not currently use drugs.  ROS:                                        Physical Exam: There were no vitals taken for this visit.  Constitutional:  Alert and oriented, No acute distress. HEENT: [place] AT, moist mucus membranes.  Trachea midline, no masses.  Laboratory Data: Lab Results  Component Value Date   WBC 7.2 06/05/2023   HGB 11.4 06/05/2023   HCT 37.2 06/05/2023   MCV 88 06/05/2023   PLT 309 06/05/2023    Lab Results  Component Value Date   CREATININE 0.68 06/05/2023    No results found for: "PSA"  No results found for: "TESTOSTERONE"  No results found for: "HGBA1C"  Urinalysis    Component Value Date/Time   COLORURINE YELLOW (A) 04/17/2021 2208   APPEARANCEUR CLEAR (A) 04/17/2021 2208  LABSPEC 1.018 04/17/2021 2208   PHURINE 5.0 04/17/2021 2208   GLUCOSEU NEGATIVE 04/17/2021 2208   HGBUR MODERATE (A) 04/17/2021 2208   BILIRUBINUR NEGATIVE 04/17/2021 2208   KETONESUR 5 (A) 04/17/2021 2208   PROTEINUR NEGATIVE 04/17/2021 2208   NITRITE NEGATIVE 04/17/2021 2208   LEUKOCYTESUR TRACE (A) 04/17/2021 2208    Pertinent Imaging: Urine reviewed and sent for culture.  Chart reviewed  Assessment & Plan: Patient has mild to moderate overactive bladder with frequency and urgency affecting her quality of life.  Role of medical and behavioral therapy discussed.  Call if urine culture positive.  Reassess in 6 weeks with pelvic examination and cystoscopy on Gemtesa samples and prescription.  There are no diagnoses linked to this encounter.  No follow-ups on file.  Martina Sinner,  MD  [facility] [address], [address] [place], [place] [address] [phone]

## 2023-07-07 NOTE — Addendum Note (Signed)
Addended by: Sueanne Margarita on: 07/07/2023 08:56 AM   Modules accepted: Orders

## 2023-07-07 NOTE — Patient Instructions (Signed)

## 2023-07-10 LAB — CULTURE, URINE COMPREHENSIVE

## 2023-07-10 MED ORDER — SUMATRIPTAN SUCCINATE 50 MG PO TABS: 50.0000 mg | ORAL_TABLET | ORAL | 0 refills | Status: DC | PRN

## 2023-07-14 ENCOUNTER — Ambulatory Visit: Payer: Medicaid Other | Admitting: Urology

## 2023-07-14 ENCOUNTER — Encounter: Payer: Self-pay | Admitting: Nurse Practitioner

## 2023-07-15 ENCOUNTER — Ambulatory Visit (INDEPENDENT_AMBULATORY_CARE_PROVIDER_SITE_OTHER): Payer: Managed Care, Other (non HMO) | Admitting: Nurse Practitioner

## 2023-07-15 ENCOUNTER — Encounter: Payer: Self-pay | Admitting: Nurse Practitioner

## 2023-07-15 VITALS — BP 112/66 | HR 69 | Temp 98.7°F | Ht 67.0 in | Wt 169.8 lb

## 2023-07-15 DIAGNOSIS — G43E09 Chronic migraine with aura, not intractable, without status migrainosus: Secondary | ICD-10-CM | POA: Diagnosis not present

## 2023-07-15 MED ORDER — NURTEC 75 MG PO TBDP: 75.0000 mg | ORAL_TABLET | ORAL | 2 refills | Status: DC

## 2023-07-15 NOTE — Assessment & Plan Note (Signed)
Chronic, ongoing since teen years. Have not improved with Imitrex, Maxalt, Gabapentin, Amitriptyline in past.  No neuro red flags on exam.  Would avoid BB or ACE/ARB due to baseline lower HR and BP.  Due to mental health medications limits what can be used.  Will start Nurtec every other day.  We discussed injectables, but she prefers a pill.  Keep Imitrex on board for now for acute while starting Nurtec.  May slowly stop Gabapentin over upcoming weeks.  Discussed plan at length with her and educated her on Nurtec + side effects and how it works.  May need imaging in future if ongoing daily headaches -- MRI brain and possibly imaging cervical spine.

## 2023-07-15 NOTE — Patient Instructions (Signed)
Migraine Headache A migraine headache is a very strong throbbing pain on one or both sides of your head. This type of headache can also cause other symptoms. It can last from 4 hours to 3 days. Talk with your doctor about what things may bring on (trigger) this condition. What are the causes? The exact cause of a migraine is not known. This condition may be brought on or caused by: Smoking. Medicines, such as: Medicine used to treat chest pain (nitroglycerin). Birth control pills. Estrogen. Some blood pressure medicines. Certain substances in some foods or drinks. Foods and drinks, such as: Cheese. Chocolate. Alcohol. Caffeine. Doing physical activity that is very hard. Other things that may trigger a migraine headache include: Periods. Pregnancy. Hunger. Stress. Getting too much or too little sleep. Weather changes. Feeling tired (fatigue). What increases the risk? Being 25-55 years old. Being female. Having a family history of migraine headaches. Being Caucasian. Having a mental health condition, such as being sad (depressed) or feeling worried or nervous (anxious). Being very overweight (obese). What are the signs or symptoms? A throbbing pain. This pain may: Happen in any area of the head, such as on one or both sides. Make it hard to do daily activities. Get worse with physical activity. Get worse around bright lights, loud noises, or smells. Other symptoms may include: Feeling like you may vomit (nauseous). Vomiting. Dizziness. Before a migraine headache starts, you may get warning signs (an aura). An aura may include: Seeing flashing lights or having blind spots. Seeing bright spots, halos, or zigzag lines. Having tunnel vision or blurred vision. Having numbness or a tingling feeling. Having trouble talking. Having weak muscles. After a migraine ends, you may have symptoms. These may include: Tiredness. Trouble thinking (concentrating). How is this  treated? Taking medicines that: Relieve pain. Relieve the feeling like you may vomit. Prevent migraine headaches. Treatment may also include: Acupuncture. Lifestyle changes like avoiding foods that bring on migraine headaches. Learning ways to control your body functions (biofeedback). Therapy to help you know and deal with negative thoughts (cognitive behavioral therapy). Follow these instructions at home: Medicines Take over-the-counter and prescription medicines only as told by your doctor. If told, take steps to prevent problems with pooping (constipation). You may need to: Drink enough fluid to keep your pee (urine) pale yellow. Take medicines. You will be told what medicines to take. Eat foods that are high in fiber. These include beans, whole grains, and fresh fruits and vegetables. Limit foods that are high in fat and sugar. These include fried or sweet foods. Ask your doctor if you should avoid driving or using machines while you are taking your medicine. Lifestyle  Do not drink alcohol. Do not smoke or use any products that contain nicotine or tobacco. If you need help quitting, ask your doctor. Get 7-9 hours of sleep each night, or the amount recommended by your doctor. Find ways to deal with stress, such as meditation, deep breathing, or yoga. Try to exercise often. This can help lessen how bad and how often your migraines happen. General instructions Keep a journal to find out what may bring on your migraine headaches. This can help you avoid those things. For example, write down: What you eat and drink. How much sleep you get. Any change to your medicines or diet. If you have a migraine headache: Avoid things that make your symptoms worse, such as bright lights. Lie down in a dark, quiet room. Do not drive or use machinery. Ask your   doctor what activities are safe for you. Where to find more information Coalition for Headache and Migraine Patients (CHAMP):  headachemigraine.org American Migraine Foundation: americanmigrainefoundation.org National Headache Foundation: headaches.org Contact a doctor if: You get a migraine headache that is different or worse than others you have had. You have more than 15 days of headaches in one month. Get help right away if: Your migraine headache gets very bad. Your migraine headache lasts more than 72 hours. You have a fever or stiff neck. You have trouble seeing. Your muscles feel weak or like you cannot control them. You lose your balance a lot. You have trouble walking. You faint. You have a seizure. This information is not intended to replace advice given to you by your health care provider. Make sure you discuss any questions you have with your health care provider. Document Revised: 05/20/2022 Document Reviewed: 05/20/2022 Elsevier Patient Education  2024 Elsevier Inc.  

## 2023-07-15 NOTE — Progress Notes (Signed)
BP 112/66   Pulse 69   Temp 98.7 F (37.1 C) (Oral)   Ht 5\' 7"  (1.702 m)   Wt 169 lb 12.8 oz (77 kg)   SpO2 98%   BMI 26.59 kg/m    Subjective:    Patient ID: Linus Salmons, female    DOB: November 18, 1985, 37 y.o.   MRN: [id number]  HPI: Raegyn Renda is a 37 y.o. female  Chief Complaint  Patient presents with   Migraine    Patient states she has been experiencing migraines for years, states she has been getting at least 1 to 2 per week. States she takes Imitrex and it does not really do anything for her.    MIGRAINES Ongoing issues with migraines.  Currently taking Imitrex with no benefit for acute migraines and Gabapentin with no benefit for prevention.  She has tried Maxalt with no benefit. Currently follows with psychiatry and taking Vraylar and Trintellix.  At baseline her HR is on lower side, so concern for adding beta blocker or ACE/ARB.    Currently having 1-2 migraines a week.  These last 24 hours or more.   Duration: chronic Onset: gradual Severity: 7/10 yesterday Quality: sharp, dull, aching, and throbbing Frequency: intermittent Location: frontal and radiate to back of head Headache duration: 24 hours or more Radiation: yes Time of day headache occurs: varies Alleviating factors: cold pack Aggravating factors: unknown Headache status at time of visit: asymptomatic Treatments attempted: rest, ice, APAP, ibuprofen, aleve", excedrine, and triptans   Aura: yes Nausea:  no Vomiting: no Photophobia:  yes Phonophobia:  yes Effect on social functioning:  yes Numbers of missed days of school/work each month: none Confusion:  no Gait disturbance/ataxia:  no Behavioral changes:  no Fevers:  no   Relevant past medical, surgical, family and social history reviewed and updated as indicated. Interim medical history since our last visit reviewed. Allergies and medications reviewed and updated.  Review of Systems  Constitutional:  Negative for activity change,  appetite change, diaphoresis, fatigue and fever.  Respiratory:  Negative for cough, chest tightness and shortness of breath.   Cardiovascular:  Negative for chest pain, palpitations and leg swelling.  Neurological:  Positive for headaches. Negative for dizziness, syncope, weakness, light-headedness and numbness.  Psychiatric/Behavioral: Negative.      Per HPI unless specifically indicated above     Objective:    BP 112/66   Pulse 69   Temp 98.7 F (37.1 C) (Oral)   Ht 5\' 7"  (1.702 m)   Wt 169 lb 12.8 oz (77 kg)   SpO2 98%   BMI 26.59 kg/m   Wt Readings from Last 3 Encounters:  07/15/23 169 lb 12.8 oz (77 kg)  07/07/23 169 lb (76.7 kg)  07/04/23 165 lb (74.8 kg)    Physical Exam Vitals and nursing note reviewed.  Constitutional:      General: She is awake. She is not in acute distress.    Appearance: Normal appearance. She is well-developed and well-groomed. She is not ill-appearing or toxic-appearing.  HENT:     Head: Normocephalic.     Right Ear: Hearing and external ear normal.     Left Ear: Hearing and external ear normal.  Eyes:     General: Lids are normal.        Right eye: No discharge.        Left eye: No discharge.     Conjunctiva/sclera: Conjunctivae normal.     Pupils: Pupils are equal, round, and reactive to  light.  Neck:     Thyroid: No thyromegaly.     Vascular: No carotid bruit.  Cardiovascular:     Rate and Rhythm: Normal rate and regular rhythm.     Heart sounds: Normal heart sounds. No murmur heard.    No gallop.  Pulmonary:     Effort: Pulmonary effort is normal. No accessory muscle usage or respiratory distress.     Breath sounds: Normal breath sounds.  Abdominal:     General: Bowel sounds are normal. There is no distension.     Palpations: Abdomen is soft.     Tenderness: There is no abdominal tenderness.  Musculoskeletal:     Cervical back: Normal range of motion and neck supple.     Right lower leg: No edema.     Left lower leg: No  edema.  Lymphadenopathy:     Cervical: No cervical adenopathy.  Skin:    General: Skin is warm and dry.  Neurological:     Mental Status: She is alert and oriented to person, place, and time.     Cranial Nerves: Cranial nerves 2-12 are intact.     Coordination: Coordination is intact.     Gait: Gait is intact.     Deep Tendon Reflexes: Reflexes are normal and symmetric.     Reflex Scores:      Brachioradialis reflexes are 2+ on the right side and 2+ on the left side.      Patellar reflexes are 2+ on the right side and 2+ on the left side. Psychiatric:        Attention and Perception: Attention normal.        Mood and Affect: Mood normal.        Speech: Speech normal.        Behavior: Behavior normal. Behavior is cooperative.        Thought Content: Thought content normal.    Results for orders placed or performed in visit on 07/07/23  CULTURE, URINE COMPREHENSIVE   Specimen: Urine   UR  Result Value Ref Range   Urine Culture, Comprehensive Final report    Organism ID, Bacteria Comment   Microscopic Examination   Urine  Result Value Ref Range   WBC, UA 6-10 (A) 0 - 5 /hpf   RBC, Urine 0-2 0 - 2 /hpf   Epithelial Cells (non renal) 0-10 0 - 10 /hpf   Bacteria, UA None seen None seen/Few  Urinalysis, Complete  Result Value Ref Range   Specific Gravity, UA 1.010 1.005 - 1.030   pH, UA 5.5 5.0 - 7.5   Color, UA Yellow Yellow   Appearance Ur Clear Clear   Leukocytes,UA Trace (A) Negative   Protein,UA Negative Negative/Trace   Glucose, UA Negative Negative   Ketones, UA Negative Negative   RBC, UA Trace (A) Negative   Bilirubin, UA Negative Negative   Urobilinogen, Ur 0.2 0.2 - 1.0 mg/dL   Nitrite, UA Negative Negative   Microscopic Examination See below:       Assessment & Plan:   Problem List Items Addressed This Visit       Cardiovascular and Mediastinum   Chronic migraine with aura without status migrainosus, not intractable - Primary    Chronic, ongoing since  teen years. Have not improved with Imitrex, Maxalt, Gabapentin, Amitriptyline in past.  No neuro red flags on exam.  Would avoid BB or ACE/ARB due to baseline lower HR and BP.  Due to mental health medications limits what can be  used.  Will start Nurtec every other day.  We discussed injectables, but she prefers a pill.  Keep Imitrex on board for now for acute while starting Nurtec.  May slowly stop Gabapentin over upcoming weeks.  Discussed plan at length with her and educated her on Nurtec + side effects and how it works.  May need imaging in future if ongoing daily headaches -- MRI brain and possibly imaging cervical spine.      Relevant Medications   vortioxetine HBr (TRINTELLIX) 10 MG TABS tablet   Rimegepant Sulfate (NURTEC) 75 MG TBDP     Follow up plan: Return for as scheduled October 25th.

## 2023-07-24 ENCOUNTER — Encounter: Payer: Self-pay | Admitting: Nurse Practitioner

## 2023-07-27 NOTE — Patient Instructions (Signed)
Migraine Headache A migraine headache is a very strong throbbing pain on one or both sides of your head. This type of headache can also cause other symptoms. It can last from 4 hours to 3 days. Talk with your doctor about what things may bring on (trigger) this condition. What are the causes? The exact cause of a migraine is not known. This condition may be brought on or caused by: Smoking. Medicines, such as: Medicine used to treat chest pain (nitroglycerin). Birth control pills. Estrogen. Some blood pressure medicines. Certain substances in some foods or drinks. Foods and drinks, such as: Cheese. Chocolate. Alcohol. Caffeine. Doing physical activity that is very hard. Other things that may trigger a migraine headache include: Periods. Pregnancy. Hunger. Stress. Getting too much or too little sleep. Weather changes. Feeling tired (fatigue). What increases the risk? Being 25-55 years old. Being female. Having a family history of migraine headaches. Being Caucasian. Having a mental health condition, such as being sad (depressed) or feeling worried or nervous (anxious). Being very overweight (obese). What are the signs or symptoms? A throbbing pain. This pain may: Happen in any area of the head, such as on one or both sides. Make it hard to do daily activities. Get worse with physical activity. Get worse around bright lights, loud noises, or smells. Other symptoms may include: Feeling like you may vomit (nauseous). Vomiting. Dizziness. Before a migraine headache starts, you may get warning signs (an aura). An aura may include: Seeing flashing lights or having blind spots. Seeing bright spots, halos, or zigzag lines. Having tunnel vision or blurred vision. Having numbness or a tingling feeling. Having trouble talking. Having weak muscles. After a migraine ends, you may have symptoms. These may include: Tiredness. Trouble thinking (concentrating). How is this  treated? Taking medicines that: Relieve pain. Relieve the feeling like you may vomit. Prevent migraine headaches. Treatment may also include: Acupuncture. Lifestyle changes like avoiding foods that bring on migraine headaches. Learning ways to control your body functions (biofeedback). Therapy to help you know and deal with negative thoughts (cognitive behavioral therapy). Follow these instructions at home: Medicines Take over-the-counter and prescription medicines only as told by your doctor. If told, take steps to prevent problems with pooping (constipation). You may need to: Drink enough fluid to keep your pee (urine) pale yellow. Take medicines. You will be told what medicines to take. Eat foods that are high in fiber. These include beans, whole grains, and fresh fruits and vegetables. Limit foods that are high in fat and sugar. These include fried or sweet foods. Ask your doctor if you should avoid driving or using machines while you are taking your medicine. Lifestyle  Do not drink alcohol. Do not smoke or use any products that contain nicotine or tobacco. If you need help quitting, ask your doctor. Get 7-9 hours of sleep each night, or the amount recommended by your doctor. Find ways to deal with stress, such as meditation, deep breathing, or yoga. Try to exercise often. This can help lessen how bad and how often your migraines happen. General instructions Keep a journal to find out what may bring on your migraine headaches. This can help you avoid those things. For example, write down: What you eat and drink. How much sleep you get. Any change to your medicines or diet. If you have a migraine headache: Avoid things that make your symptoms worse, such as bright lights. Lie down in a dark, quiet room. Do not drive or use machinery. Ask your   doctor what activities are safe for you. Where to find more information Coalition for Headache and Migraine Patients (CHAMP):  headachemigraine.org American Migraine Foundation: americanmigrainefoundation.org National Headache Foundation: headaches.org Contact a doctor if: You get a migraine headache that is different or worse than others you have had. You have more than 15 days of headaches in one month. Get help right away if: Your migraine headache gets very bad. Your migraine headache lasts more than 72 hours. You have a fever or stiff neck. You have trouble seeing. Your muscles feel weak or like you cannot control them. You lose your balance a lot. You have trouble walking. You faint. You have a seizure. This information is not intended to replace advice given to you by your health care provider. Make sure you discuss any questions you have with your health care provider. Document Revised: 05/20/2022 Document Reviewed: 05/20/2022 Elsevier Patient Education  2024 Elsevier Inc.  

## 2023-07-28 ENCOUNTER — Telehealth: Payer: Self-pay | Admitting: Nurse Practitioner

## 2023-07-28 NOTE — Telephone Encounter (Signed)
Pt is calling in to check on her Prior Authorization for her medication: Rimegepant Sulfate (NURTEC) 75 MG TBDP.   Pt states that she is out of the samples that she was given and is wanting to know if she can get some more samples until she is able to get her prescription?   Please call pt back.

## 2023-07-28 NOTE — Telephone Encounter (Signed)
PA for Nurtec initiated and submitted via [email or web address]. PA ID: [id number]

## 2023-07-29 NOTE — Telephone Encounter (Signed)
PA approved. Called and notified patient of approval.

## 2023-08-01 ENCOUNTER — Ambulatory Visit (INDEPENDENT_AMBULATORY_CARE_PROVIDER_SITE_OTHER): Payer: Managed Care, Other (non HMO) | Admitting: Nurse Practitioner

## 2023-08-01 VITALS — BP 106/70 | HR 80 | Temp 98.3°F | Ht 67.0 in | Wt 165.6 lb

## 2023-08-01 DIAGNOSIS — Z720 Tobacco use: Secondary | ICD-10-CM | POA: Diagnosis not present

## 2023-08-01 DIAGNOSIS — G43E09 Chronic migraine with aura, not intractable, without status migrainosus: Secondary | ICD-10-CM | POA: Diagnosis not present

## 2023-08-01 MED ORDER — NURTEC 75 MG PO TBDP: 75.0000 mg | ORAL_TABLET | ORAL | 12 refills | Status: DC

## 2023-08-01 NOTE — Progress Notes (Signed)
BP 106/70   Pulse 80   Temp 98.3 F (36.8 C) (Oral)   Ht 5\' 7"  (1.702 m)   Wt 165 lb 9.6 oz (75.1 kg)   SpO2 98%   BMI 25.94 kg/m    Subjective:    Patient ID: Gail Mullins, female    DOB: 1986/01/31, 37 y.o.   MRN: [id number]  HPI: Gail Mullins is a 37 y.o. female  Chief Complaint  Patient presents with   Migraine    4 week f/up- patient states she is feeling better since last visit. States the Nurtec has been helping.    MIGRAINES Ongoing issues with migraines.  Currently taking Nurtec every other day for maintenance, started 07/15/23.  This is really helping.  Follows with psychiatry and taking Vraylar and Trintellix.  At baseline her HR is on lower side, so concern for adding beta blocker or ACE/ARB.  Tried Imitrex, Maxalt, and Gabapentin in past with no benefit.  Has had to use Imitrex twice since last visit. She does vape nicotine, has cut back on this -- uses one vape in 1 1/2 weeks. Duration: chronic Onset: gradual Severity:5/10  Quality: sharp, dull, aching, and throbbing Frequency: intermittent Location: frontal and radiate to back of head Headache duration: 24 hours  Radiation: yes Time of day headache occurs: varies Alleviating factors: cold pack Aggravating factors: unknown Headache status at time of visit: asymptomatic Treatments attempted: rest, ice, APAP, ibuprofen, aleve", excedrine, and triptans   Aura: yes Nausea:  no Vomiting: no Photophobia:  yes Phonophobia:  yes Effect on social functioning:  yes Numbers of missed days of school/work each month: none Confusion:  no Gait disturbance/ataxia:  no Behavioral changes:  no Fevers:  no   Relevant past medical, surgical, family and social history reviewed and updated as indicated. Interim medical history since our last visit reviewed. Allergies and medications reviewed and updated.  Review of Systems  Constitutional:  Negative for activity change, appetite change, diaphoresis, fatigue  and fever.  Respiratory:  Negative for cough, chest tightness and shortness of breath.   Cardiovascular:  Negative for chest pain, palpitations and leg swelling.  Neurological:  Positive for headaches. Negative for dizziness, syncope, weakness, light-headedness and numbness.  Psychiatric/Behavioral: Negative.      Per HPI unless specifically indicated above     Objective:    BP 106/70   Pulse 80   Temp 98.3 F (36.8 C) (Oral)   Ht 5\' 7"  (1.702 m)   Wt 165 lb 9.6 oz (75.1 kg)   SpO2 98%   BMI 25.94 kg/m   Wt Readings from Last 3 Encounters:  08/01/23 165 lb 9.6 oz (75.1 kg)  07/15/23 169 lb 12.8 oz (77 kg)  07/07/23 169 lb (76.7 kg)    Physical Exam Vitals and nursing note reviewed.  Constitutional:      General: She is awake. She is not in acute distress.    Appearance: Normal appearance. She is well-developed and well-groomed. She is not ill-appearing or toxic-appearing.  HENT:     Head: Normocephalic.     Right Ear: Hearing and external ear normal.     Left Ear: Hearing and external ear normal.  Eyes:     General: Lids are normal.        Right eye: No discharge.        Left eye: No discharge.     Conjunctiva/sclera: Conjunctivae normal.     Pupils: Pupils are equal, round, and reactive to light.  Neck:  Thyroid: No thyromegaly.     Vascular: No carotid bruit.  Cardiovascular:     Rate and Rhythm: Normal rate and regular rhythm.     Heart sounds: Normal heart sounds. No murmur heard.    No gallop.  Pulmonary:     Effort: Pulmonary effort is normal. No accessory muscle usage or respiratory distress.     Breath sounds: Normal breath sounds.  Abdominal:     General: Bowel sounds are normal. There is no distension.     Palpations: Abdomen is soft.     Tenderness: There is no abdominal tenderness.  Musculoskeletal:     Cervical back: Normal range of motion and neck supple.     Right lower leg: No edema.     Left lower leg: No edema.  Lymphadenopathy:      Cervical: No cervical adenopathy.  Skin:    General: Skin is warm and dry.  Neurological:     Mental Status: She is alert and oriented to person, place, and time.     Cranial Nerves: Cranial nerves 2-12 are intact.     Coordination: Coordination is intact.     Gait: Gait is intact.     Deep Tendon Reflexes: Reflexes are normal and symmetric.     Reflex Scores:      Brachioradialis reflexes are 2+ on the right side and 2+ on the left side.      Patellar reflexes are 2+ on the right side and 2+ on the left side. Psychiatric:        Attention and Perception: Attention normal.        Mood and Affect: Mood normal.        Speech: Speech normal.        Behavior: Behavior normal. Behavior is cooperative.        Thought Content: Thought content normal.    Results for orders placed or performed in visit on 07/07/23  CULTURE, URINE COMPREHENSIVE   Specimen: Urine   UR  Result Value Ref Range   Urine Culture, Comprehensive Final report    Organism ID, Bacteria Comment   Microscopic Examination   Urine  Result Value Ref Range   WBC, UA 6-10 (A) 0 - 5 /hpf   RBC, Urine 0-2 0 - 2 /hpf   Epithelial Cells (non renal) 0-10 0 - 10 /hpf   Bacteria, UA None seen None seen/Few  Urinalysis, Complete  Result Value Ref Range   Specific Gravity, UA 1.010 1.005 - 1.030   pH, UA 5.5 5.0 - 7.5   Color, UA Yellow Yellow   Appearance Ur Clear Clear   Leukocytes,UA Trace (A) Negative   Protein,UA Negative Negative/Trace   Glucose, UA Negative Negative   Ketones, UA Negative Negative   RBC, UA Trace (A) Negative   Bilirubin, UA Negative Negative   Urobilinogen, Ur 0.2 0.2 - 1.0 mg/dL   Nitrite, UA Negative Negative   Microscopic Examination See below:       Assessment & Plan:   Problem List Items Addressed This Visit       Cardiovascular and Mediastinum   Chronic migraine with aura without status migrainosus, not intractable - Primary    Chronic, ongoing since teen years, however now is  improved.  Drastic reduction in migraines with Nurtec as maintenance. Did not improve with Imitrex, Maxalt, Gabapentin, Amitriptyline in past.  No neuro red flags on exam.  She will continue Imitrex for acute though.  Would avoid BB or ACE/ARB due to baseline  lower HR and BP.  Due to mental health medications limits what can be used.  Continue Nurtec every other day.  We discussed injectables, but she prefers a pill.  May need imaging in future if ongoing or worsening headaches -- MRI brain and possibly imaging cervical spine.      Relevant Medications   vortioxetine HBr (TRINTELLIX) 5 MG TABS tablet   Rimegepant Sulfate (NURTEC) 75 MG TBDP     Other   Vapes nicotine containing substance    I have recommended complete cessation of tobacco use. I have discussed various options available for assistance with tobacco cessation including over the counter methods (Nicotine gum, patch and lozenges). We also discussed prescription options (Chantix, Nicotine Inhaler / Nasal Spray). The patient is not interested in pursuing any prescription tobacco cessation options at this time.          Follow up plan: Return in about 6 months (around 01/30/2024) for Migraines and Mood.

## 2023-08-01 NOTE — Assessment & Plan Note (Signed)
I have recommended complete cessation of tobacco use. I have discussed various options available for assistance with tobacco cessation including over the counter methods (Nicotine gum, patch and lozenges). We also discussed prescription options (Chantix, Nicotine Inhaler / Nasal Spray). The patient is not interested in pursuing any prescription tobacco cessation options at this time.  

## 2023-08-01 NOTE — Assessment & Plan Note (Addendum)
Chronic, ongoing since teen years, however now is improved.  Drastic reduction in migraines with Nurtec as maintenance. Did not improve with Imitrex, Maxalt, Gabapentin, Amitriptyline in past.  No neuro red flags on exam.  She will continue Imitrex for acute though.  Would avoid BB or ACE/ARB due to baseline lower HR and BP.  Due to mental health medications limits what can be used.  Continue Nurtec every other day.  We discussed injectables, but she prefers a pill.  May need imaging in future if ongoing or worsening headaches -- MRI brain and possibly imaging cervical spine.

## 2023-08-14 ENCOUNTER — Telehealth: Payer: Self-pay | Admitting: Nurse Practitioner

## 2023-08-14 NOTE — Telephone Encounter (Signed)
Pt is calling in because her refill for Rimegepant Sulfate (NURTEC) 75 MG TBDP [[id number]] was denied and according to the pharmacy it was denied due to the way the prescription was written. Pt is requesting a new one be sent in. Pt is also requesting a call back from the office regarding this matter.

## 2023-08-14 NOTE — Telephone Encounter (Signed)
Routed to clinical

## 2023-08-15 ENCOUNTER — Other Ambulatory Visit: Payer: Self-pay | Admitting: Nurse Practitioner

## 2023-08-15 NOTE — Telephone Encounter (Signed)
Called and notified patient of Jolene's message.

## 2023-08-15 NOTE — Telephone Encounter (Signed)
Called to follow up with Gail Mullins at [facility] and was informed that the patient picked up the last prescription on 07/29/23. Gail Mullins says [employer] only covers it to be filled every 30 days. Please advise?

## 2023-08-22 MED ORDER — NURTEC 75 MG PO TBDP: 75.0000 mg | ORAL_TABLET | ORAL | 12 refills | Status: DC

## 2023-08-22 NOTE — Telephone Encounter (Signed)
Mandy calling from [facility] is calling to reort that the Rimegepant Sulfate (NURTEC) 75 MG TBDP [[id number]]  comes in quanties of 16. Script is written for 15. Script need to read day supply is 30.

## 2023-08-22 NOTE — Telephone Encounter (Signed)
Routing to provider. Can we change RX to 16 tablets per 30 days?

## 2023-08-22 NOTE — Addendum Note (Signed)
Addended by: Aura Dials T on: 08/22/2023 04:26 PM   Modules accepted: Orders

## 2023-08-25 ENCOUNTER — Ambulatory Visit: Payer: Managed Care, Other (non HMO) | Admitting: Urology

## 2023-08-25 ENCOUNTER — Encounter: Payer: Self-pay | Admitting: Urology

## 2023-08-25 ENCOUNTER — Telehealth: Payer: Self-pay | Admitting: Nurse Practitioner

## 2023-08-25 VITALS — BP 116/79 | HR 75 | Ht 67.0 in | Wt 170.0 lb

## 2023-08-25 DIAGNOSIS — N3941 Urge incontinence: Secondary | ICD-10-CM

## 2023-08-25 DIAGNOSIS — R3915 Urgency of urination: Secondary | ICD-10-CM

## 2023-08-25 DIAGNOSIS — R32 Unspecified urinary incontinence: Secondary | ICD-10-CM

## 2023-08-25 DIAGNOSIS — R350 Frequency of micturition: Secondary | ICD-10-CM

## 2023-08-25 DIAGNOSIS — N3281 Overactive bladder: Secondary | ICD-10-CM

## 2023-08-25 LAB — URINALYSIS, COMPLETE
Bilirubin, UA: NEGATIVE
Glucose, UA: NEGATIVE
Ketones, UA: NEGATIVE
Nitrite, UA: NEGATIVE
Protein,UA: NEGATIVE
Specific Gravity, UA: 1.01 (ref 1.005–1.030)
Urobilinogen, Ur: 0.2 mg/dL (ref 0.2–1.0)
pH, UA: 6.5 (ref 5.0–7.5)

## 2023-08-25 LAB — MICROSCOPIC EXAMINATION

## 2023-08-25 MED ORDER — NURTEC 75 MG PO TBDP: 75.0000 mg | ORAL_TABLET | ORAL | 2 refills | Status: DC

## 2023-08-25 NOTE — Progress Notes (Signed)
08/25/2023 8:55 AM   Gail Mullins [birth year] [id number]  Referring provider: Marjie Skiff, NP [address] [place],  [place] [address]  No chief complaint on file.   HPI: I was consulted to assess the patient's urgency and frequency.  Her last 3 years she can have sudden urgency especially when she goes from sitting to standing position.  She can leak a small amount but does not wear a pad and is almost daily.  Caffeine reduction helps some.  No stress incontinence or bedwetting   She can void twice in an hour and cannot sit for 2 hours watching movie without needing to urinate.  She gets up once at night.   Flow was poor and she feels empty    Patient has mild to moderate overactive bladder with frequency and urgency affecting her quality of life.  Role of medical and behavioral therapy discussed.  Call if urine culture positive.  Reassess in 6 weeks with pelvic examination and cystoscopy on Gemtesa samples and prescription.  Today Frequency stable.  Last culture negative Urgency and frequency dramatically better.  Has only leaked twice and is very pleased. On pelvic examination she had grade 2 hypermobility the bladder neck and negative cough test after cystoscopy and no prolapse Cystoscopy: Patient underwent flexible cystoscopy.  Bladder mucosa and trigone were normal.  No cystitis.  No carcinoma.  Well-tolerated     PMH: Past Medical History:  Diagnosis Date   Alcohol abuse    Anxiety    Depression     Surgical History: Past Surgical History:  Procedure Laterality Date   ESOPHAGOGASTRODUODENOSCOPY N/A 08/15/2019   Procedure: ESOPHAGOGASTRODUODENOSCOPY (EGD);  Surgeon: Toledo, Boykin Nearing, MD;  Location: [facility] ENDOSCOPY;  Service: Gastroenterology;  Laterality: N/A;    Home Medications:  Allergies as of 08/25/2023   No Known Allergies      Medication List        Accurate as of August 25, 2023  8:55 AM. If you have any questions, ask your nurse or doctor.           Gemtesa 75 MG Tabs Generic drug: Vibegron Take 1 tablet (75 mg total) by mouth daily.   Nurtec 75 MG Tbdp Generic drug: Rimegepant Sulfate Take 1 tablet (75 mg total) by mouth every other day.   OVER THE COUNTER MEDICATION Take 1 tablet by mouth daily. Blood Builder   SUMAtriptan 50 MG tablet Commonly known as: Imitrex Take 1 tablet (50 mg total) by mouth every 2 (two) hours as needed for migraine. May repeat in 2 hours if headache persists or recurs.   Trintellix 10 MG Tabs tablet Generic drug: vortioxetine HBr Take 10 mg by mouth daily.   Trintellix 5 MG Tabs tablet Generic drug: vortioxetine HBr Take 5 mg by mouth daily.   Vraylar 3 MG capsule Generic drug: cariprazine Take 3 mg by mouth daily.        Allergies: No Known Allergies  Family History: Family History  Problem Relation Age of Onset   Breast cancer Neg Hx     Social History:  reports that she has been smoking cigarettes. She has a 7.5 pack-year smoking history. She has been exposed to tobacco smoke. She has never used smokeless tobacco. She reports that she does not currently use alcohol. She reports that she does not currently use drugs.  ROS:  Physical Exam: There were no vitals taken for this visit.  Constitutional:  Alert and oriented, No acute distress. HEENT: [place] AT, moist mucus membranes.  Trachea midline, no masses.   Laboratory Data: Lab Results  Component Value Date   WBC 7.2 06/05/2023   HGB 11.4 06/05/2023   HCT 37.2 06/05/2023   MCV 88 06/05/2023   PLT 309 06/05/2023    Lab Results  Component Value Date   CREATININE 0.68 06/05/2023    No results found for: "PSA"  No results found for: "TESTOSTERONE"  No results found for: "HGBA1C"  Urinalysis    Component Value Date/Time   COLORURINE YELLOW (A) 04/17/2021 2208   APPEARANCEUR Clear 07/07/2023 0838   LABSPEC 1.018 04/17/2021 2208   PHURINE  5.0 04/17/2021 2208   GLUCOSEU Negative 07/07/2023 0838   HGBUR MODERATE (A) 04/17/2021 2208   BILIRUBINUR Negative 07/07/2023 0838   KETONESUR 5 (A) 04/17/2021 2208   PROTEINUR Negative 07/07/2023 0838   PROTEINUR NEGATIVE 04/17/2021 2208   NITRITE Negative 07/07/2023 0838   NITRITE NEGATIVE 04/17/2021 2208   LEUKOCYTESUR Trace (A) 07/07/2023 0838   LEUKOCYTESUR TRACE (A) 04/17/2021 2208    Pertinent Imaging:   Assessment & Plan: Prescription renewed.  Reassess durability in 4 months and then annually  There are no diagnoses linked to this encounter.  No follow-ups on file.  Martina Sinner, MD  [facility] [address] [place], [place] [address] [phone]

## 2023-08-25 NOTE — Telephone Encounter (Signed)
Copied from CRM [id number]. Topic: General - Other >> Aug 25, 2023 11:26 AM Everette C wrote: Reason for CRM: Mandi with [facility] has called to request an updated prescription for Rimegepant Sulfate (NURTEC) 75 MG TBDP [[id number]] that needs to expressly state   "Quantity 16 for for a 30 day supply" in order for the patient's insurance to cover the prescription/auditing and compliance   Please contact Mandi further if needed

## 2023-09-01 ENCOUNTER — Other Ambulatory Visit: Payer: Self-pay | Admitting: Urology

## 2023-09-01 ENCOUNTER — Encounter: Payer: Self-pay | Admitting: Urology

## 2023-09-01 ENCOUNTER — Other Ambulatory Visit: Payer: Self-pay

## 2023-09-01 MED ORDER — VIBEGRON 75 MG PO TABS
1.0000 | ORAL_TABLET | Freq: Every day | ORAL | 11 refills | Status: DC
  Filled 2023-09-01 – 2023-09-10 (×6): qty 30, 30d supply, fill #0

## 2023-09-03 ENCOUNTER — Other Ambulatory Visit: Payer: Self-pay

## 2023-09-05 ENCOUNTER — Other Ambulatory Visit: Payer: Self-pay

## 2023-09-07 ENCOUNTER — Other Ambulatory Visit: Payer: Self-pay

## 2023-09-08 ENCOUNTER — Other Ambulatory Visit: Payer: Self-pay

## 2023-09-10 ENCOUNTER — Encounter: Payer: Self-pay | Admitting: Urology

## 2023-09-10 ENCOUNTER — Other Ambulatory Visit: Payer: Self-pay

## 2023-09-10 DIAGNOSIS — N3281 Overactive bladder: Secondary | ICD-10-CM

## 2023-09-10 DIAGNOSIS — N3941 Urge incontinence: Secondary | ICD-10-CM

## 2023-11-04 ENCOUNTER — Ambulatory Visit: Payer: Medicaid Other | Admitting: Nurse Practitioner

## 2023-11-26 ENCOUNTER — Other Ambulatory Visit: Payer: Self-pay | Admitting: Nurse Practitioner

## 2023-11-27 ENCOUNTER — Other Ambulatory Visit: Payer: Self-pay | Admitting: Nurse Practitioner

## 2023-11-27 NOTE — Telephone Encounter (Signed)
 Requested medication (s) are due for refill today: Yes  Requested medication (s) are on the active medication list: Yes  Last refill:  08/25/23  Future visit scheduled: Yes  Notes to clinic:  Manual review.    Requested Prescriptions  Pending Prescriptions Disp Refills   NURTEC 75 MG TBDP [Pharmacy Med Name: NURTEC 75 MG ODT] 16 tablet 2    Sig: TAKE 1 TABLET BY MOUTH EVERY OTHERY DAY AS DIRECTED     Off-Protocol Failed - 11/27/2023  8:19 AM      Failed - Medication not assigned to a protocol, review manually.      Passed - Valid encounter within last 12 months    Recent Outpatient Visits           3 months ago Chronic migraine with aura without status migrainosus, not intractable   [place] [facility] [place], [place] T, NP   4 months ago Chronic migraine with aura without status migrainosus, not intractable   [place] [facility] [place], [place] T, NP   4 months ago Chronic migraine with aura without status migrainosus, not intractable   [place] [facility] [place], [place] T, NP   5 months ago Chronic migraine with aura without status migrainosus, not intractable   [place] [facility] [place], Dorie Rank, NP       Future Appointments             In 3 weeks MacDiarmid, Lorin Picket, MD [facility]    In 2 months [place], Dorie Rank, NP [facility]

## 2023-11-28 NOTE — Telephone Encounter (Signed)
 Requested Prescriptions  Pending Prescriptions Disp Refills   SUMAtriptan (IMITREX) 50 MG tablet [Pharmacy Med Name: SUMATRIPTAN SUCCINATE 50 MG TAB] 20 tablet 0    Sig: TAKE 1 TABLET BY MOUTH EVERY 2 HOURS AS NEEDED MIGRAINE MAY REPEAT IN 2 HOURSIF NEEDED     Neurology:  Migraine Therapy - Triptan Passed - 11/28/2023  8:43 AM      Passed - Last BP in normal range    BP Readings from Last 1 Encounters:  08/25/23 116/79         Passed - Valid encounter within last 12 months    Recent Outpatient Visits           3 months ago Chronic migraine with aura without status migrainosus, not intractable   [place] [facility] [place], [place] T, NP   4 months ago Chronic migraine with aura without status migrainosus, not intractable   [facility] [place], [place] T, NP   4 months ago Chronic migraine with aura without status migrainosus, not intractable   [place] [facility] [place], Beatty T, NP   5 months ago Chronic migraine with aura without status migrainosus, not intractable   [place] [facility] [place], Dorie Rank, NP       Future Appointments             In 3 weeks MacDiarmid, Lorin Picket, MD [facility] [place]   In 2 months [place], Dorie Rank, NP [place] [facility]

## 2023-11-29 NOTE — Patient Instructions (Signed)
 Vomiting, Adult Vomiting is when stomach contents forcefully come out of the mouth. Many people notice nausea before vomiting. Vomiting can make you feel weak and cause you to become dehydrated. Dehydration can make you feel tired and thirsty, cause you to have a dry mouth, and decrease how often you urinate. Older adults and people who have other diseases or a weak body defense system (immune system) are at higher risk for dehydration. It is important to treat vomiting as told by your health care provider. Follow these instructions at home:  Watch your symptoms for any changes. Tell your health care provider about them. Eating and drinking     Follow these recommendations as told by your health care provider: Take an oral rehydration solution (ORS). This is a drink that is sold at pharmacies and retail stores. Eat bland, easy-to-digest foods in small amounts as you are able. These foods include bananas, applesauce, rice, lean meats, toast, and crackers. Drink clear fluids slowly and in small amounts as you are able. Clear fluids include water, ice chips, low-calorie sports drinks, and fruit juice that has water added (diluted fruit juice). Avoid drinking fluids that contain a lot of sugar or caffeine, such as energy drinks, sports drinks, and soda. Avoid alcohol. Avoid spicy or fatty foods.  General instructions Wash your hands often using soap and water for at least 20 seconds. If soap and water are not available, use hand sanitizer. Make sure that everyone in your household washes their hands frequently. Take over-the-counter and prescription medicines only as told by your health care provider. Rest at home while you recover. Watch your condition for any changes. Keep all follow-up visits. This is important. Contact a health care provider if: Your vomiting gets worse. You have new symptoms. You have a fever. You cannot drink fluids without vomiting. You feel light-headed or  dizzy. You have a headache. You have muscle cramps. You have a rash. You have pain while urinating. Get help right away if: You have pain in your chest, neck, arm, or jaw. Your heart is beating very quickly. You have trouble breathing or you are breathing very quickly. You feel extremely weak or you faint. Your skin feels cold and clammy. You feel confused. You have persistent vomiting. You have vomit that is bright red or looks like black coffee grounds. You have stools (feces) that are bloody or black, or stools that look like tar. You have a severe headache, a stiff neck, or both. You have severe pain, cramping, or bloating in your abdomen. You have signs of dehydration, such as: Dark urine, very little urine, or no urine. Cracked lips. Dry mouth. Sunken eyes. Sleepiness. Weakness. These symptoms may be an emergency. Get help right away. Call 911. Do not wait to see if the symptoms will go away. Do not drive yourself to the hospital. Summary Vomiting is when stomach contents forcefully come out of the mouth. Vomiting can cause you to become dehydrated. It is important to treat vomiting as told by your health care provider. Follow your health care provider's instructions about eating and drinking. Wash your hands often using soap and water for at least 20 seconds. If soap and water are not available, use hand sanitizer. Watch your condition for any changes and for signs of dehydration. Keep all follow-up visits. This is important. This information is not intended to replace advice given to you by your health care provider. Make sure you discuss any questions you have with your health care provider.  Document Revised: 03/30/2021 Document Reviewed: 03/30/2021 Elsevier Patient Education  2024 [employer].

## 2023-12-01 ENCOUNTER — Encounter: Payer: Self-pay | Admitting: Nurse Practitioner

## 2023-12-01 ENCOUNTER — Telehealth (INDEPENDENT_AMBULATORY_CARE_PROVIDER_SITE_OTHER): Payer: Medicaid Other | Admitting: Nurse Practitioner

## 2023-12-01 DIAGNOSIS — R630 Anorexia: Secondary | ICD-10-CM

## 2023-12-01 DIAGNOSIS — F321 Major depressive disorder, single episode, moderate: Secondary | ICD-10-CM | POA: Diagnosis not present

## 2023-12-01 DIAGNOSIS — R110 Nausea: Secondary | ICD-10-CM | POA: Diagnosis not present

## 2023-12-01 DIAGNOSIS — F4310 Post-traumatic stress disorder, unspecified: Secondary | ICD-10-CM

## 2023-12-01 DIAGNOSIS — R112 Nausea with vomiting, unspecified: Secondary | ICD-10-CM | POA: Insufficient documentation

## 2023-12-01 MED ORDER — ONDANSETRON 4 MG PO TBDP: 4.0000 mg | ORAL_TABLET | Freq: Three times a day (TID) | ORAL | 0 refills | Status: DC | PRN

## 2023-12-01 NOTE — Assessment & Plan Note (Signed)
 Ongoing for a few months on and off, with reduced appetite.  Will send in Zofran to take as needed at this time.  Obtain labs outpatient.  No pain or weight loss present with her symptoms.  ?related to depression.  We discussed correlation between mood and GI issues.  She will discuss with psychiatry.  Plan for return in 2 weeks, may need imaging if ongoing.

## 2023-12-01 NOTE — Assessment & Plan Note (Signed)
 Refer to nausea plan of care.

## 2023-12-01 NOTE — Assessment & Plan Note (Signed)
Chronic, ongoing.  Refer to depression plan of care.

## 2023-12-01 NOTE — Progress Notes (Signed)
 There were no vitals taken for this visit.   Subjective:    Patient ID: Gail Mullins, female    DOB: December 15, 1985, 38 y.o.   MRN: [id number]  HPI: Gail Mullins is a 38 y.o. female  Chief Complaint  Patient presents with   Appetite Changes    Patient states she has been experiencing days in the last week where she doesn't feel like eating anything at all. States she has also been having nausea spells and did vomit one time. States she has been able to snack on a few things throughout the day, but not much. States she has done this as well as over ate some days too.    Virtual Visit via Video Note  I connected with Gail Mullins on 12/01/23 at  4:20 PM EST by a video enabled telemedicine application and verified that I am speaking with the correct person using two identifiers.  Location: Patient: home Provider: work   I discussed the limitations of evaluation and management by telemedicine and the availability of in person appointments. The patient expressed understanding and agreed to proceed.  I discussed the assessment and treatment plan with the patient. The patient was provided an opportunity to ask questions and all were answered. The patient agreed with the plan and demonstrated an understanding of the instructions.   The patient was advised to call back or seek an in-person evaluation if the symptoms worsen or if the condition fails to improve as anticipated.  I provided 25 minutes of non-face-to-face time during this encounter.   Marjie Skiff, NP   APPETITE CHANGES Has been noticing appetite changes off and on over the past 3 months.  She gets random bouts of nausea ongoing for 2 months, last was Wednesday morning last week.  Most days food is repulsive. In morning still takes vitamins and drinks protein shake. No pain with this.  Denies any heart burn with this.  Sees new psychiatrist tomorrow afternoon for her depression and anxiety.  Current  medications have been consistent since November.  Has been more depressed recently, last few days especially -- news does not help.   Duration:months Frequency: intermittent Alleviating factors: nothing Aggravating factors: nothing Status: a little bit better Treatments attempted: none Fever: no Nausea: yes Vomiting: yes Weight loss: no Decreased appetite: yes Diarrhea: no Constipation: no Blood in stool: no Heartburn: no Jaundice: no Rash: no Dysuria/urinary frequency: no Hematuria: no History of sexually transmitted disease: no    08/01/2023   10:00 AM 07/15/2023    8:06 AM 07/04/2023    9:47 AM 06/05/2023    9:53 AM  Depression screen PHQ 2/9  Decreased Interest 1 2 2 3   Down, Depressed, Hopeless 1 2 2 2   PHQ - 2 Score 2 4 4 5   Altered sleeping 2 1 1 2   Tired, decreased energy 2 2 3 3   Change in appetite 2 2 2 3   Feeling bad or failure about yourself  1 2 1 3   Trouble concentrating 2 2 3 2   Moving slowly or fidgety/restless 0 1 1 2   Suicidal thoughts 0 0 0 1  PHQ-9 Score 11 14 15 21   Difficult doing work/chores Not difficult at all Very difficult Not difficult at all Very difficult       08/01/2023   10:00 AM 07/15/2023    8:06 AM 07/04/2023    9:47 AM 06/05/2023    9:54 AM  GAD 7 : Generalized Anxiety Score  Nervous, Anxious, on Edge 2  1 1 2   Control/stop worrying 1 2 1 1   Worry too much - different things 1 1 2 2   Trouble relaxing 1 2 2 3   Restless 1 2 2 2   Easily annoyed or irritable 0 1 1 1   Afraid - awful might happen 1 1 1 2   Total GAD 7 Score 7 10 10 13   Anxiety Difficulty Somewhat difficult Very difficult Very difficult Somewhat difficult   Relevant past medical, surgical, family and social history reviewed and updated as indicated. Interim medical history since our last visit reviewed. Allergies and medications reviewed and updated.  Review of Systems  Constitutional:  Positive for appetite change and fatigue. Negative for activity change, chills  and fever.  Respiratory:  Negative for cough, chest tightness, shortness of breath and wheezing.   Cardiovascular:  Negative for chest pain, palpitations and leg swelling.  Gastrointestinal:  Positive for nausea and vomiting. Negative for abdominal distention, abdominal pain, blood in stool, constipation, diarrhea and rectal pain.  Neurological: Negative.   Psychiatric/Behavioral:  Negative for decreased concentration, self-injury, sleep disturbance and suicidal ideas. The patient is nervous/anxious.     Per HPI unless specifically indicated above     Objective:    There were no vitals taken for this visit.  Wt Readings from Last 3 Encounters:  08/25/23 170 lb (77.1 kg)  08/01/23 165 lb 9.6 oz (75.1 kg)  07/15/23 169 lb 12.8 oz (77 kg)    Physical Exam Vitals and nursing note reviewed.  Constitutional:      General: She is awake. She is not in acute distress.    Appearance: She is well-developed. She is not ill-appearing.  HENT:     Head: Normocephalic.     Right Ear: Hearing normal.     Left Ear: Hearing normal.  Eyes:     General: Lids are normal.        Right eye: No discharge.        Left eye: No discharge.     Conjunctiva/sclera: Conjunctivae normal.  Pulmonary:     Effort: Pulmonary effort is normal. No accessory muscle usage or respiratory distress.  Musculoskeletal:     Cervical back: Normal range of motion.  Neurological:     Mental Status: She is alert and oriented to person, place, and time.  Psychiatric:        Attention and Perception: Attention normal.        Mood and Affect: Mood normal.        Behavior: Behavior normal. Behavior is cooperative.        Thought Content: Thought content normal.        Judgment: Judgment normal.    Results for orders placed or performed in visit on 08/25/23  Microscopic Examination   Collection Time: 08/25/23  9:05 AM   Urine  Result Value Ref Range   WBC, UA 0-5 0 - 5 /hpf   RBC, Urine 11-30 (A) 0 - 2 /hpf    Epithelial Cells (non renal) 0-10 0 - 10 /hpf   Bacteria, UA Few None seen/Few  Urinalysis, Complete   Collection Time: 08/25/23  9:05 AM  Result Value Ref Range   Specific Gravity, UA 1.010 1.005 - 1.030   pH, UA 6.5 5.0 - 7.5   Color, UA Yellow Yellow   Appearance Ur Clear Clear   Leukocytes,UA Trace (A) Negative   Protein,UA Negative Negative/Trace   Glucose, UA Negative Negative   Ketones, UA Negative Negative   RBC, UA 2+ (A)  Negative   Bilirubin, UA Negative Negative   Urobilinogen, Ur 0.2 0.2 - 1.0 mg/dL   Nitrite, UA Negative Negative   Microscopic Examination See below:       Assessment & Plan:   Problem List Items Addressed This Visit       Other   Depression, major, single episode, moderate (HCC) - Primary   Chronic, ongoing.  Followed by psychiatry.  Denies SI/HI today.  Will continue collaboration with psychiatry and therapy + regimen as ordered by them.  ?if current GI symptoms related to mood, will check labs and send in Zofran for this.      Loss of appetite for more than 2 weeks   Refer to nausea plan of care.      Relevant Orders   CBC with Differential/Platelet   Comprehensive metabolic panel   TSH   Gamma GT   Amylase   Lipase   Nausea   Ongoing for a few months on and off, with reduced appetite.  Will send in Zofran to take as needed at this time.  Obtain labs outpatient.  No pain or weight loss present with her symptoms.  ?related to depression.  We discussed correlation between mood and GI issues.  She will discuss with psychiatry.  Plan for return in 2 weeks, may need imaging if ongoing.      Relevant Orders   CBC with Differential/Platelet   Comprehensive metabolic panel   TSH   Gamma GT   Amylase   Lipase   PTSD (post-traumatic stress disorder)   Chronic, ongoing.  Refer to depression plan of care.        Follow up plan: Return in about 2 weeks (around 12/15/2023) for NAUSEA AND REDUCED APPETITE.

## 2023-12-01 NOTE — Assessment & Plan Note (Signed)
 Chronic, ongoing.  Followed by psychiatry.  Denies SI/HI today.  Will continue collaboration with psychiatry and therapy + regimen as ordered by them.  ?if current GI symptoms related to mood, will check labs and send in Zofran for this.

## 2023-12-05 ENCOUNTER — Other Ambulatory Visit: Payer: Self-pay | Admitting: Nurse Practitioner

## 2023-12-05 ENCOUNTER — Other Ambulatory Visit: Payer: Managed Care, Other (non HMO)

## 2023-12-05 DIAGNOSIS — R630 Anorexia: Secondary | ICD-10-CM

## 2023-12-05 DIAGNOSIS — R110 Nausea: Secondary | ICD-10-CM

## 2023-12-06 ENCOUNTER — Encounter: Payer: Self-pay | Admitting: Nurse Practitioner

## 2023-12-06 LAB — COMPREHENSIVE METABOLIC PANEL
ALT: 10 IU/L (ref 0–32)
AST: 16 IU/L (ref 0–40)
Albumin: 4.3 g/dL (ref 3.9–4.9)
Alkaline Phosphatase: 61 IU/L (ref 44–121)
BUN/Creatinine Ratio: 12 (ref 9–23)
BUN: 9 mg/dL (ref 6–20)
Bilirubin Total: <0.2 mg/dL (ref 0.0–1.2)
CO2: 22 mmol/L (ref 20–29)
Calcium: 9.5 mg/dL (ref 8.7–10.2)
Chloride: 101 mmol/L (ref 96–106)
Creatinine, Ser: 0.73 mg/dL (ref 0.57–1.00)
Globulin, Total: 2.5 g/dL (ref 1.5–4.5)
Glucose: 86 mg/dL (ref 70–99)
Potassium: 4.6 mmol/L (ref 3.5–5.2)
Sodium: 140 mmol/L (ref 134–144)
Total Protein: 6.8 g/dL (ref 6.0–8.5)
eGFR: 109 mL/min/{1.73_m2} (ref >59)

## 2023-12-06 LAB — CBC WITH DIFFERENTIAL/PLATELET
Basophils Absolute: 0.1 10*3/uL (ref 0.0–0.2)
Basos: 1 %
EOS (ABSOLUTE): 0.5 10*3/uL — ABNORMAL HIGH (ref 0.0–0.4)
Eos: 4 %
Hematocrit: 40.1 % (ref 34.0–46.6)
Hemoglobin: 12.6 g/dL (ref 11.1–15.9)
Immature Grans (Abs): 0 10*3/uL (ref 0.0–0.1)
Immature Granulocytes: 0 %
Lymphocytes Absolute: 3.5 10*3/uL — ABNORMAL HIGH (ref 0.7–3.1)
Lymphs: 33 %
MCH: 29.7 pg (ref 26.6–33.0)
MCHC: 31.4 g/dL — ABNORMAL LOW (ref 31.5–35.7)
MCV: 95 fL (ref 79–97)
Monocytes Absolute: 0.4 10*3/uL (ref 0.1–0.9)
Monocytes: 4 %
Neutrophils Absolute: 6.1 10*3/uL (ref 1.4–7.0)
Neutrophils: 58 %
Platelets: 350 10*3/uL (ref 150–450)
RBC: 4.24 x10E6/uL (ref 3.77–5.28)
RDW: 12.6 % (ref 11.7–15.4)
WBC: 10.6 10*3/uL (ref 3.4–10.8)

## 2023-12-06 LAB — AMYLASE: Amylase: 73 U/L (ref 31–110)

## 2023-12-06 LAB — GAMMA GT: GGT: 14 IU/L (ref 0–60)

## 2023-12-06 LAB — LIPASE: Lipase: 112 U/L — ABNORMAL HIGH (ref 14–72)

## 2023-12-06 LAB — TSH: TSH: 1.98 u[IU]/mL (ref 0.450–4.500)

## 2023-12-06 NOTE — Progress Notes (Signed)
 Contacted via MyChart   Good morning Gail Mullins, your labs have returned: - CBC shows mild elevation in lymphocytes, one of your white blood cells, and eosinophils.  This could mean possible mild viral infection. - Lipase is a little elevated, but Amylase normal.  These are pancreas labs.  There is some post marketing data on Trintellix and possible acute pancreatitis.  Did you talk to psychiatry about your medications?  We may obtain imaging in future if ongoing symptoms. - Kidney function, creatinine and eGFR, remains normal, as is liver function, AST and ALT.  - Remainder of labs stable.  Any questions? Keep being stellar!!  Thank you for allowing me to participate in your care.  I appreciate you. Kindest regards, Jesilyn Easom

## 2023-12-14 NOTE — Patient Instructions (Incomplete)
Pancreatitis Eating Plan Pancreatitis is when your pancreas gets irritated and swollen (inflamed). The pancreas is a small gland behind your stomach. It helps your body digest food and regulate your blood sugar. Pancreatitis can affect how your body digests food, especially foods with fat. You may also have other symptoms such as pain in your abdomen (abdominal pain) or nausea. When you have pancreatitis, following a low-fat eating plan may help you manage symptoms and recover faster. Work with your health care provider or a dietitian to create an eating plan that is right for you. What are tips for following this plan? Reading food labels Use the information on food labels to help keep track of how much fat you eat: Check the serving size. Look for the amount of total fat in grams (g) in one serving. Low-fat foods have 3 g of fat or less per serving. Fat-free foods have 0.5 g of fat or less per serving. Keep track of how much fat you eat based on how many servings you eat. For example, if you eat two servings, the amount of fat you eat will be twice what is listed on the label. Shopping  Buy low-fat or nonfat foods, such as: Fresh, frozen, or canned fruits and vegetables. Grains, including pasta, bread, and rice. Lean meat, poultry, fish, and other protein foods. Low-fat or nonfat dairy. Avoid buying bakery products and other sweets made with whole milk, butter, and eggs. Avoid buying snack foods with added fat, such as anything with butter or cheese flavoring. Cooking Remove skin from poultry, and remove extra fat from meat. Limit the amount of fat and oil you use to 6 tsp (30 mL) or less per day. Cook using low-fat methods, such as boiling, broiling, grilling, steaming, or baking. Use spray oil to cook. Add fat-free chicken broth to add flavor and moisture. Avoid adding cream to thicken soups or sauces. Use other thickeners such as corn starch or tomato paste. Meal planning  Eat a  low-fat diet as told by your dietitian. For most people, this means having no more than 55-65 g of fat each day. Eat small, frequent meals throughout the day. For example, you may have 5 or 6 small meals instead of 3 large meals. Drink enough fluid to keep your urine pale yellow. Do not drink alcohol. Talk to your health care provider if you need help stopping. Limit how much caffeine you have, including black coffee, black and green tea, soft drinks with caffeine, and energy drinks. Plan to include a variety of foods in your diet. Include fruits, vegetables, whole grains and lean proteins, and low-fat or nonfat dairy. You need a balanced diet for good overall health. General information Let your health care provider or dietitian know if you have unplanned weight loss on this eating plan. You may be told to follow a clear liquid or soft food diet when symptoms come back, which is called a flare. Talk with your health care provider about how to manage your diet during symptoms of a flare. Take any vitamins or supplements as told by your health care provider. You may need to take: Extra vitamins that dissolve in fat (are fat soluble), such as vitamins A, D, E, and K. Nutritional supplements. Work with a dietitian, especially if you have other conditions such as obesity, osteoporosis, or diabetes mellitus. Some people need extra treatments, such as: Pills or capsules to replace enzymes (oral pancreatic enzyme replacement therapy). Feedings through a tube in the stomach or intestine (  enteral feedings). What foods should I avoid? Fruits Fried fruits. Fruits served with butter or cream. Vegetables Fried vegetables. Vegetables cooked with butter, cheese, or cream. Grains Biscuits, waffles, donuts, pastries, and croissants. Pies and cookies. Butter-flavored popcorn. Regular crackers. Meats and other proteins Fatty cuts of meat. Poultry with skin. Organ meats. Precooked or cured meat, such as sausages  or meat loaves. Whole eggs. Nuts and nut butters. Dairy Whole and 2% milk. Whole milk yogurt. Whole milk ice cream. Cream and half-and-half. Cheese, such as cream cheese. Sour cream. Beverages Wine, beer, and liquor. The items listed above may not be a complete list of foods and beverages you should avoid. Contact a dietitian for more information. Summary Pancreatitis can affect how your body digests food, especially foods with fat. When you have pancreatitis, it is recommended that you follow a low-fat eating plan to help you recover faster and manage symptoms. Do not drink alcohol. Limit the amount of caffeine you have, and drink enough fluid to keep your urine pale yellow. This information is not intended to replace advice given to you by your health care provider. Make sure you discuss any questions you have with your health care provider. Document Revised: 09/12/2021 Document Reviewed: 09/12/2021 Elsevier Patient Education  2024 [employer]  

## 2023-12-16 ENCOUNTER — Encounter: Payer: Self-pay | Admitting: Nurse Practitioner

## 2023-12-16 ENCOUNTER — Ambulatory Visit (INDEPENDENT_AMBULATORY_CARE_PROVIDER_SITE_OTHER): Payer: Managed Care, Other (non HMO) | Admitting: Nurse Practitioner

## 2023-12-16 VITALS — BP 123/74 | HR 99 | Temp 98.8°F | Ht 67.0 in | Wt 181.0 lb

## 2023-12-16 DIAGNOSIS — R748 Abnormal levels of other serum enzymes: Secondary | ICD-10-CM

## 2023-12-16 DIAGNOSIS — F321 Major depressive disorder, single episode, moderate: Secondary | ICD-10-CM

## 2023-12-16 DIAGNOSIS — F4310 Post-traumatic stress disorder, unspecified: Secondary | ICD-10-CM

## 2023-12-16 DIAGNOSIS — R110 Nausea: Secondary | ICD-10-CM

## 2023-12-16 NOTE — Assessment & Plan Note (Signed)
 Chronic, ongoing.  Followed by psychiatry.  Denies SI/HI today.  Will continue collaboration with psychiatry and therapy + regimen as ordered by them. ?if Trintellix causing mild pancreatitis recently, they are reducing her off of this.  Her appetite is returning with Mirtazapine.  Recheck labs today.

## 2023-12-16 NOTE — Assessment & Plan Note (Signed)
Chronic, ongoing.  Refer to depression plan of care.

## 2023-12-16 NOTE — Progress Notes (Signed)
 BP 123/74   Pulse 99   Temp 98.8 F (37.1 C) (Oral)   Ht 5\' 7"  (1.702 m)   Wt 181 lb (82.1 kg)   LMP  (LMP Unknown)   SpO2 98%   BMI 28.35 kg/m    Subjective:    Patient ID: Linus Salmons, female    DOB: 24-May-1986, 38 y.o.   MRN: [id number]  HPI: Aune Adami is a 38 y.o. female  Chief Complaint  Patient presents with   Depression   DEPRESSION & NAUSEA Follow-up today for recent nausea and loss of appetite.  She follows with psychiatry -- they are reducing Trintellix (her nausea and appetite changed after this was increased in November) and started Mirtazapine recently.  Recent labs overall reassuring with exception of lipase being elevated, amylase WNL.  Appetite has been better at present, still has some nausea.  Has taken 4 Zofran since last visit.  Continues on Vraylar.  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 Mood status: uncontrolled Satisfied with current treatment?: yes Symptom severity: moderate  Duration of current treatment : chronic Side effects: no Medication compliance: good compliance Psychotherapy/counseling: yes in the past Depressed mood: yes manageable, has been able to read Anxious mood: improving some Anhedonia: improving a little bit Significant weight loss or gain: no Insomnia: sleeping a little too much, 9 to 10 hours Fatigue: no Feelings of worthlessness or guilt: no Impaired concentration/indecisiveness: no Suicidal ideations: no Hopelessness: no Crying spells: no    12/16/2023    3:24 PM 08/01/2023   10:00 AM 07/15/2023    8:06 AM 07/04/2023    9:47 AM 06/05/2023    9:53 AM  Depression screen PHQ 2/9  Decreased Interest 1 1 2 2 3   Down, Depressed, Hopeless 2 1 2 2 2   PHQ - 2 Score 3 2 4 4 5   Altered sleeping 2 2 1 1 2   Tired, decreased energy 2 2 2 3 3   Change in appetite 1 2 2 2 3   Feeling bad or failure about yourself  1 1 2 1 3   Trouble concentrating 1 2 2 3 2   Moving slowly or fidgety/restless 1 0 1 1 2   Suicidal  thoughts 0 0 0 0 1  PHQ-9 Score 11 11 14 15 21   Difficult doing work/chores Not difficult at all Not difficult at all Very difficult Not difficult at all Very difficult       12/16/2023    3:25 PM 08/01/2023   10:00 AM 07/15/2023    8:06 AM 07/04/2023    9:47 AM  GAD 7 : Generalized Anxiety Score  Nervous, Anxious, on Edge 1 2 1 1   Control/stop worrying 1 1 2 1   Worry too much - different things 2 1 1 2   Trouble relaxing 1 1 2 2   Restless 1 1 2 2   Easily annoyed or irritable 1 0 1 1  Afraid - awful might happen 1 1 1 1   Total GAD 7 Score 8 7 10 10   Anxiety Difficulty Not difficult at all Somewhat difficult Very difficult Very difficult   Relevant past medical, surgical, family and social history reviewed and updated as indicated. Interim medical history since our last visit reviewed. Allergies and medications reviewed and updated.  Review of Systems  Constitutional:  Positive for fatigue. Negative for activity change, appetite change, chills and fever.  Respiratory:  Negative for cough, chest tightness, shortness of breath and wheezing.   Cardiovascular:  Negative for chest pain, palpitations and leg  swelling.  Gastrointestinal:  Positive for nausea. Negative for abdominal distention, abdominal pain, blood in stool, constipation, diarrhea, rectal pain and vomiting.  Neurological: Negative.   Psychiatric/Behavioral:  Negative for decreased concentration, self-injury, sleep disturbance and suicidal ideas. The patient is nervous/anxious.     Per HPI unless specifically indicated above     Objective:    BP 123/74   Pulse 99   Temp 98.8 F (37.1 C) (Oral)   Ht 5\' 7"  (1.702 m)   Wt 181 lb (82.1 kg)   LMP  (LMP Unknown)   SpO2 98%   BMI 28.35 kg/m   Wt Readings from Last 3 Encounters:  12/16/23 181 lb (82.1 kg)  08/25/23 170 lb (77.1 kg)  08/01/23 165 lb 9.6 oz (75.1 kg)    Physical Exam Vitals and nursing note reviewed.  Constitutional:      General: She is awake. She  is not in acute distress.    Appearance: She is well-developed and well-groomed. She is not ill-appearing or toxic-appearing.  HENT:     Head: Normocephalic.     Right Ear: Hearing and external ear normal.     Left Ear: Hearing and external ear normal.  Eyes:     General: Lids are normal.        Right eye: No discharge.        Left eye: No discharge.     Conjunctiva/sclera: Conjunctivae normal.     Pupils: Pupils are equal, round, and reactive to light.  Neck:     Thyroid: No thyromegaly.     Vascular: No carotid bruit.  Cardiovascular:     Rate and Rhythm: Normal rate and regular rhythm.     Heart sounds: Normal heart sounds. No murmur heard.    No gallop.  Pulmonary:     Effort: Pulmonary effort is normal. No accessory muscle usage or respiratory distress.     Breath sounds: Normal breath sounds.  Abdominal:     General: Bowel sounds are normal. There is no distension.     Palpations: Abdomen is soft. There is no hepatomegaly or mass.     Tenderness: There is no abdominal tenderness. There is no guarding or rebound. Negative signs include Murphy's sign.  Musculoskeletal:     Cervical back: Normal range of motion and neck supple.     Right lower leg: No edema.     Left lower leg: No edema.  Lymphadenopathy:     Cervical: No cervical adenopathy.  Skin:    General: Skin is warm and dry.  Neurological:     Mental Status: She is alert and oriented to person, place, and time.     Deep Tendon Reflexes: Reflexes are normal and symmetric.     Reflex Scores:      Brachioradialis reflexes are 2+ on the right side and 2+ on the left side.      Patellar reflexes are 2+ on the right side and 2+ on the left side. Psychiatric:        Attention and Perception: Attention normal.        Mood and Affect: Mood normal.        Speech: Speech normal.        Behavior: Behavior normal. Behavior is cooperative.        Thought Content: Thought content normal.    Results for orders placed or  performed in visit on 12/05/23  CBC with Differential/Platelet   Collection Time: 12/05/23  8:49 AM  Result Value Ref  Range   WBC 10.6 3.4 - 10.8 x10E3/uL   RBC 4.24 3.77 - 5.28 x10E6/uL   Hemoglobin 12.6 11.1 - 15.9 g/dL   Hematocrit 16.1 09.6 - 46.6 %   MCV 95 79 - 97 fL   MCH 29.7 26.6 - 33.0 pg   MCHC 31.4 (L) 31.5 - 35.7 g/dL   RDW 04.5 40.9 - 81.1 %   Platelets 350 150 - 450 x10E3/uL   Neutrophils 58 Not Estab. %   Lymphs 33 Not Estab. %   Monocytes 4 Not Estab. %   Eos 4 Not Estab. %   Basos 1 Not Estab. %   Neutrophils Absolute 6.1 1.4 - 7.0 x10E3/uL   Lymphocytes Absolute 3.5 (H) 0.7 - 3.1 x10E3/uL   Monocytes Absolute 0.4 0.1 - 0.9 x10E3/uL   EOS (ABSOLUTE) 0.5 (H) 0.0 - 0.4 x10E3/uL   Basophils Absolute 0.1 0.0 - 0.2 x10E3/uL   Immature Granulocytes 0 Not Estab. %   Immature Grans (Abs) 0.0 0.0 - 0.1 x10E3/uL  Comprehensive metabolic panel   Collection Time: 12/05/23  8:49 AM  Result Value Ref Range   Glucose 86 70 - 99 mg/dL   BUN 9 6 - 20 mg/dL   Creatinine, Ser 9.14 0.57 - 1.00 mg/dL   eGFR 782 >95 AO/ZHY/8.65   BUN/Creatinine Ratio 12 9 - 23   Sodium 140 134 - 144 mmol/L   Potassium 4.6 3.5 - 5.2 mmol/L   Chloride 101 96 - 106 mmol/L   CO2 22 20 - 29 mmol/L   Calcium 9.5 8.7 - 10.2 mg/dL   Total Protein 6.8 6.0 - 8.5 g/dL   Albumin 4.3 3.9 - 4.9 g/dL   Globulin, Total 2.5 1.5 - 4.5 g/dL   Bilirubin Total <7.8 0.0 - 1.2 mg/dL   Alkaline Phosphatase 61 44 - 121 IU/L   AST 16 0 - 40 IU/L   ALT 10 0 - 32 IU/L  TSH   Collection Time: 12/05/23  8:49 AM  Result Value Ref Range   TSH 1.980 0.450 - 4.500 uIU/mL  Gamma GT   Collection Time: 12/05/23  8:49 AM  Result Value Ref Range   GGT 14 0 - 60 IU/L  Lipase   Collection Time: 12/05/23  8:49 AM  Result Value Ref Range   Lipase 112 (H) 14 - 72 U/L  Amylase   Collection Time: 12/05/23  8:49 AM  Result Value Ref Range   Amylase 73 31 - 110 U/L      Assessment & Plan:   Problem List Items Addressed  This Visit       Other   Depression, major, single episode, moderate (HCC) - Primary   Chronic, ongoing.  Followed by psychiatry.  Denies SI/HI today.  Will continue collaboration with psychiatry and therapy + regimen as ordered by them. ?if Trintellix causing mild pancreatitis recently, they are reducing her off of this.  Her appetite is returning with Mirtazapine.  Recheck labs today.      Relevant Medications   mirtazapine (REMERON) 30 MG tablet   Nausea   Appetite improving, continues to have some nausea but Zofran helps this.  ?if Trintellix causing mild pancreatitis recently, they are reducing her off of this.  Her appetite is returning with Mirtazapine.  Recheck labs today.  Educated her on pancreatitis.      PTSD (post-traumatic stress disorder)   Chronic, ongoing.  Refer to depression plan of care.      Relevant Medications   mirtazapine (REMERON) 30  MG tablet   Other Visit Diagnoses       Elevated amylase measurement       Recheck levels today.   Relevant Orders   Amylase   Lipase        Follow up plan: Return for as scheduled in April 2025.

## 2023-12-16 NOTE — Assessment & Plan Note (Signed)
 Appetite improving, continues to have some nausea but Zofran helps this.  ?if Trintellix causing mild pancreatitis recently, they are reducing her off of this.  Her appetite is returning with Mirtazapine.  Recheck labs today.  Educated her on pancreatitis.

## 2023-12-17 ENCOUNTER — Encounter: Payer: Self-pay | Admitting: Nurse Practitioner

## 2023-12-17 ENCOUNTER — Other Ambulatory Visit: Payer: Self-pay | Admitting: Nurse Practitioner

## 2023-12-17 DIAGNOSIS — R110 Nausea: Secondary | ICD-10-CM

## 2023-12-17 DIAGNOSIS — R748 Abnormal levels of other serum enzymes: Secondary | ICD-10-CM

## 2023-12-17 LAB — AMYLASE: Amylase: 123 U/L — ABNORMAL HIGH (ref 31–110)

## 2023-12-17 LAB — LIPASE: Lipase: 293 U/L — ABNORMAL HIGH (ref 14–72)

## 2023-12-17 NOTE — Progress Notes (Signed)
 Lab appt scheduled.

## 2023-12-17 NOTE — Progress Notes (Signed)
 Contacted via MyChart -- needs 2 week lab only visit please   Good morning Gail Mullins, your pancreas labs have returned and have trended up a little.  I would recommend obtaining a CT of your belly to further assess pancreas.  Would you be okay with this? Then I would like to recheck these levels outpatient in 2 weeks.  Please let me know if you are okay with me ordering CT.   Keep being amazing!!  Thank you for allowing me to participate in your care.  I appreciate you. Kindest regards, Brinklee Cisse

## 2023-12-22 ENCOUNTER — Ambulatory Visit (INDEPENDENT_AMBULATORY_CARE_PROVIDER_SITE_OTHER): Payer: Managed Care, Other (non HMO) | Admitting: Urology

## 2023-12-22 VITALS — BP 126/74 | HR 74

## 2023-12-22 DIAGNOSIS — N3281 Overactive bladder: Secondary | ICD-10-CM

## 2023-12-22 DIAGNOSIS — N3941 Urge incontinence: Secondary | ICD-10-CM

## 2023-12-22 MED ORDER — OXYBUTYNIN CHLORIDE ER 10 MG PO TB24: 10.0000 mg | ORAL_TABLET | Freq: Every day | ORAL | 11 refills | Status: DC

## 2023-12-22 NOTE — Patient Instructions (Signed)
Oxybutynin Extended-Release Tablets What is this medication? OXYBUTYNIN (ox i BYOO ti nin) treats symptoms of an overactive bladder, such as loss of bladder control or frequent need to urinate. It works by relaxing muscles in the bladder. It belongs to a group of medications called antispasmodics. This medicine may be used for other purposes; ask your health care provider or pharmacist if you have questions. COMMON BRAND NAME(S): Ditropan XL What should I tell my care team before I take this medication? They need to know if you have any of these conditions: Autonomic neuropathy Dementia Glaucoma Intestinal obstruction Kidney disease Liver disease Myasthenia gravis Parkinson's disease Trouble passing urine An unusual or allergic reaction to oxybutynin, other medications, foods, dyes, or preservatives Pregnant or trying to get pregnant Breast-feeding How should I use this medication? Take this medication by mouth with a glass of water. Swallow whole, do not crush, cut, or chew. Follow the directions on the prescription label. You can take this medication with or without food. Take your doses at regular intervals. Do not take your medication more often than directed. Talk to your care team about the use of this medication in children. Special care may be needed. While this medication may be prescribed for children as young as 6 years for selected conditions, precautions do apply. Overdosage: If you think you have taken too much of this medicine contact a poison control center or emergency room at once. NOTE: This medicine is only for you. Do not share this medicine with others. What if I miss a dose? If you miss a dose, take it as soon as you can. If it is almost time for your next dose, take only that dose. Do not take double or extra doses. What may interact with this medication? Antihistamines for allergy, cough, and cold Atropine Certain medications for bladder problems, such as  oxybutynin or tolterodine Certain medications for Parkinson disease, such as benztropine or trihexyphenidyl Certain medications for stomach problems, such as dicyclomine or hyoscyamine Certain medications for travel sickness, such as scopolamine Clarithromycin Erythromycin Ipratropium Medications for fungal infections, such as fluconazole, itraconazole, ketoconazole, or voriconazole This list may not describe all possible interactions. Give your health care provider a list of all the medicines, herbs, non-prescription drugs, or dietary supplements you use. Also tell them if you smoke, drink alcohol, or use illegal drugs. Some items may interact with your medicine. What should I watch for while using this medication? Visit your care team for regular checks on your progress. It may take a few weeks to notice the full benefit from this medication. You may need to limit your intake of tea, coffee, caffeinated sodas, and alcohol. These drinks may make your symptoms worse. This medication may affect your coordination, reaction time, or judgment. Do not drive or operate machinery until you know how this medication affects you. Sit up or stand slowly to reduce the risk of dizzy or fainting spells. Drinking alcohol with this medication can increase the risk of these side effects. Your mouth may get dry. Chewing sugarless gum or sucking hard candy and drinking plenty of water may help. Contact your care team if the problem does not go away or is severe. This medication may cause dry eyes and blurred vision. If you wear contact lenses, you may feel some discomfort. Lubricating eye drops may help. See your care team if the problem does not go away or is severe. You may notice the shells of the tablets in your stool from time to time.  This is normal. Avoid extreme heat. This medication can cause you to sweat less than normal. Your body temperature could increase to dangerous levels, which may lead to heat  stroke. What side effects may I notice from receiving this medication? Side effects that you should report to your care team as soon as possible: Allergic reactions or angioedema--skin rash, itching, hives, swelling of the face, eyes, lips, tongue, arms, or legs, trouble swallowing or breathing Sudden eye pain or change in vision such as blurry vision, seeing halos around lights, vision loss Trouble passing urine Side effects that usually do not require medical attention (report to your care team if they continue or are bothersome): Confusion Constipation Dizziness Drowsiness Dry mouth Headache This list may not describe all possible side effects. Call your doctor for medical advice about side effects. You may report side effects to FDA at 1-800-FDA-1088. Where should I keep my medication? Keep out of the reach of children. Store at room temperature between 15 and 30 degrees C (59 and 86 degrees F). Protect from moisture and humidity. Throw away any unused medication after the expiration date. NOTE: This sheet is a summary. It may not cover all possible information. If you have questions about this medicine, talk to your doctor, pharmacist, or health care provider.  2024 Elsevier/Gold Standard (2022-04-03 00:00:00)

## 2023-12-22 NOTE — Progress Notes (Signed)
 12/22/2023 9:08 AM   Gail Mullins 09-27-1986 [id number]  Referring provider: Marjie Skiff, NP [address] [place],  [place] [address]  No chief complaint on file.   HPI: I was consulted to assess the patient's urgency and frequency.  Her last 3 years she can have sudden urgency especially when she goes from sitting to standing position.  She can leak a small amount but does not wear a pad and is almost daily.  Caffeine reduction helps some.  No stress incontinence or bedwetting   She can void twice in an hour and cannot sit for 2 hours watching movie without needing to urinate.  She gets up once at night.   Flow was poor and she feels empty     Patient has mild to moderate overactive bladder with frequency and urgency affecting her quality of life.  Role of medical and behavioral therapy discussed.  Call if urine culture positive.  Reassess in 6 weeks with pelvic examination and cystoscopy on Gemtesa samples and prescription.   Today Frequency stable.  Last culture negative Urgency and frequency dramatically better.  Has only leaked twice and is very pleased. On pelvic examination she had grade 2 hypermobility the bladder neck and negative cough test after cystoscopy and no prolapse Cystoscopy: Patient underwent flexible cystoscopy.  Bladder mucosa and trigone were normal.  No cystitis.  No carcinoma.  Well-tolerated  Prescription renewed. Reassess durability in 4 months and then annually   Today Insurance would not cover the [place].  She was doing great.  Now she has urge incontinence and frequency.  Clinically not infected   PMH: Past Medical History:  Diagnosis Date   Alcohol abuse    Anxiety    Depression     Surgical History: Past Surgical History:  Procedure Laterality Date   ESOPHAGOGASTRODUODENOSCOPY N/A 08/15/2019   Procedure: ESOPHAGOGASTRODUODENOSCOPY (EGD);  Surgeon: Toledo, Boykin Nearing, MD;  Location: [facility] ENDOSCOPY;  Service: Gastroenterology;   Laterality: N/A;    Home Medications:  Allergies as of 12/22/2023   No Known Allergies      Medication List        Accurate as of December 22, 2023  9:08 AM. If you have any questions, ask your nurse or doctor.          mirtazapine 30 MG tablet Commonly known as: REMERON Take 30 mg by mouth at bedtime.   Nurtec 75 MG Tbdp Generic drug: Rimegepant Sulfate TAKE 1 TABLET BY MOUTH EVERY OTHERY DAY AS DIRECTED   ondansetron 4 MG disintegrating tablet Commonly known as: ZOFRAN-ODT Take 1 tablet (4 mg total) by mouth every 8 (eight) hours as needed for nausea or vomiting.   OVER THE COUNTER MEDICATION Take 1 tablet by mouth daily. Blood Builder   SUMAtriptan 50 MG tablet Commonly known as: IMITREX TAKE 1 TABLET BY MOUTH EVERY 2 HOURS AS NEEDED MIGRAINE MAY REPEAT IN 2 HOURSIF NEEDED   Vraylar 4.5 MG Caps Generic drug: Cariprazine HCl Take 4.5 capsules by mouth daily.        Allergies: No Known Allergies  Family History: Family History  Problem Relation Age of Onset   Breast cancer Neg Hx     Social History:  reports that she has quit smoking. Her smoking use included cigarettes. She has a 7.5 pack-year smoking history. She has been exposed to tobacco smoke. She has never used smokeless tobacco. She reports that she does not currently use alcohol. She reports that she does not currently use drugs.  ROS:                                        Physical Exam: LMP  (LMP Unknown)   Constitutional:  Alert and oriented, No acute distress. HEENT: [place] AT, moist mucus membranes.  Trachea midline, no masses.  Laboratory Data: Lab Results  Component Value Date   WBC 10.6 12/05/2023   HGB 12.6 12/05/2023   HCT 40.1 12/05/2023   MCV 95 12/05/2023   PLT 350 12/05/2023    Lab Results  Component Value Date   CREATININE 0.73 12/05/2023    No results found for: "PSA"  No results found for: "TESTOSTERONE"  No results found for:  "HGBA1C"  Urinalysis    Component Value Date/Time   COLORURINE YELLOW (A) 04/17/2021 2208   APPEARANCEUR Clear 08/25/2023 0905   LABSPEC 1.018 04/17/2021 2208   PHURINE 5.0 04/17/2021 2208   GLUCOSEU Negative 08/25/2023 0905   HGBUR MODERATE (A) 04/17/2021 2208   BILIRUBINUR Negative 08/25/2023 0905   KETONESUR 5 (A) 04/17/2021 2208   PROTEINUR Negative 08/25/2023 0905   PROTEINUR NEGATIVE 04/17/2021 2208   NITRITE Negative 08/25/2023 0905   NITRITE NEGATIVE 04/17/2021 2208   LEUKOCYTESUR Trace (A) 08/25/2023 0905   LEUKOCYTESUR TRACE (A) 04/17/2021 2208    Pertinent Imaging:   Assessment & Plan: Reassess in 6 weeks on oxybutynin ER 10 mg 30 x 11.  Proceed accordingly.  1. Overactive bladder (Primary)  - Urinalysis, Complete  2. Urgency incontinence  - Urinalysis, Complete   No follow-ups on file.  Martina Sinner, MD  [facility] [address] [place], [place] [address] [phone]

## 2023-12-23 LAB — URINALYSIS, COMPLETE
Bilirubin, UA: NEGATIVE
Glucose, UA: NEGATIVE
Ketones, UA: NEGATIVE
Nitrite, UA: NEGATIVE
Protein,UA: NEGATIVE
Specific Gravity, UA: 1.015 (ref 1.005–1.030)
Urobilinogen, Ur: 0.2 mg/dL (ref 0.2–1.0)
pH, UA: 7.5 (ref 5.0–7.5)

## 2023-12-23 LAB — MICROSCOPIC EXAMINATION

## 2023-12-25 ENCOUNTER — Other Ambulatory Visit: Payer: Self-pay | Admitting: Nurse Practitioner

## 2023-12-26 NOTE — Telephone Encounter (Signed)
 Requested Prescriptions  Pending Prescriptions Disp Refills   SUMAtriptan (IMITREX) 50 MG tablet [Pharmacy Med Name: SUMATRIPTAN SUCCINATE 50 MG TAB] 20 tablet 0    Sig: TAKE 1 TABLET BY MOUTH EVERY 2 HOURS AS NEEDED MIGRAINE MAY REPEAT IN 2 HOURSIF NEEDED     Neurology:  Migraine Therapy - Triptan Passed - 12/26/2023 12:09 PM      Passed - Last BP in normal range    BP Readings from Last 1 Encounters:  12/22/23 126/74         Passed - Valid encounter within last 12 months    Recent Outpatient Visits           4 months ago Chronic migraine with aura without status migrainosus, not intractable   [place] [facility] [place], S[place] T, NP   5 months ago Chronic migraine with aura without status migrainosus, not intractable   [place] [facility] [place], Hope T, NP   5 months ago Chronic migraine with aura without status migrainosus, not intractable   [facility] [place], Hanover T, NP   6 months ago Chronic migraine with aura without status migrainosus, not intractable   [place] [facility] [place], Dorie Rank, NP       Future Appointments             In 1 month Cannady, Dorie Rank, NP  [facility]   In 1 month MacDiarmid, Lorin Picket, MD [facility] [place]

## 2023-12-28 ENCOUNTER — Encounter: Payer: Self-pay | Admitting: Nurse Practitioner

## 2024-01-02 ENCOUNTER — Other Ambulatory Visit

## 2024-01-02 DIAGNOSIS — R748 Abnormal levels of other serum enzymes: Secondary | ICD-10-CM

## 2024-01-02 DIAGNOSIS — R110 Nausea: Secondary | ICD-10-CM

## 2024-01-03 ENCOUNTER — Encounter: Payer: Self-pay | Admitting: Nurse Practitioner

## 2024-01-03 LAB — CBC WITH DIFFERENTIAL/PLATELET
Basophils Absolute: 0.1 10*3/uL (ref 0.0–0.2)
Basos: 1 %
EOS (ABSOLUTE): 0.4 10*3/uL (ref 0.0–0.4)
Eos: 6 %
Hematocrit: 37.5 % (ref 34.0–46.6)
Hemoglobin: 12.3 g/dL (ref 11.1–15.9)
Immature Grans (Abs): 0 10*3/uL (ref 0.0–0.1)
Immature Granulocytes: 0 %
Lymphocytes Absolute: 2.7 10*3/uL (ref 0.7–3.1)
Lymphs: 35 %
MCH: 30.2 pg (ref 26.6–33.0)
MCHC: 32.8 g/dL (ref 31.5–35.7)
MCV: 92 fL (ref 79–97)
Monocytes Absolute: 0.4 10*3/uL (ref 0.1–0.9)
Monocytes: 5 %
Neutrophils Absolute: 4.2 10*3/uL (ref 1.4–7.0)
Neutrophils: 53 %
Platelets: 313 10*3/uL (ref 150–450)
RBC: 4.07 x10E6/uL (ref 3.77–5.28)
RDW: 12.5 % (ref 11.7–15.4)
WBC: 7.9 10*3/uL (ref 3.4–10.8)

## 2024-01-03 LAB — COMPREHENSIVE METABOLIC PANEL WITH GFR
ALT: 17 IU/L (ref 0–32)
AST: 20 IU/L (ref 0–40)
Albumin: 4.4 g/dL (ref 3.9–4.9)
Alkaline Phosphatase: 64 IU/L (ref 44–121)
BUN/Creatinine Ratio: 9 (ref 9–23)
BUN: 7 mg/dL (ref 6–20)
Bilirubin Total: <0.2 mg/dL (ref 0.0–1.2)
CO2: 23 mmol/L (ref 20–29)
Calcium: 9.7 mg/dL (ref 8.7–10.2)
Chloride: 102 mmol/L (ref 96–106)
Creatinine, Ser: 0.8 mg/dL (ref 0.57–1.00)
Globulin, Total: 2.7 g/dL (ref 1.5–4.5)
Glucose: 82 mg/dL (ref 70–99)
Potassium: 4.5 mmol/L (ref 3.5–5.2)
Sodium: 139 mmol/L (ref 134–144)
Total Protein: 7.1 g/dL (ref 6.0–8.5)
eGFR: 97 mL/min/{1.73_m2} (ref >59)

## 2024-01-03 LAB — AMYLASE: Amylase: 60 U/L (ref 31–110)

## 2024-01-03 LAB — LIPASE: Lipase: 62 U/L (ref 14–72)

## 2024-01-03 NOTE — Progress Notes (Signed)
 Contacted via MyChart   Good evening Gail Mullins, your labs have returned and overall pancreas levels improving, amylase and lipase.  Great news!!  Any questions? Keep being amazing!!  Thank you for allowing me to participate in your care.  I appreciate you. Kindest regards, Leatta Alewine

## 2024-01-12 ENCOUNTER — Encounter: Payer: Self-pay | Admitting: Urology

## 2024-01-13 ENCOUNTER — Telehealth: Payer: Self-pay | Admitting: Urology

## 2024-01-13 NOTE — Telephone Encounter (Signed)
 Pt LMOM that Oxybutynin isn't working.  She didn't say if she wanted something different or to make an appt.

## 2024-01-16 ENCOUNTER — Ambulatory Visit (INDEPENDENT_AMBULATORY_CARE_PROVIDER_SITE_OTHER): Admitting: Physician Assistant

## 2024-01-16 ENCOUNTER — Encounter: Payer: Self-pay | Admitting: Physician Assistant

## 2024-01-16 VITALS — BP 122/77 | HR 82 | Ht 67.0 in | Wt 182.0 lb

## 2024-01-16 DIAGNOSIS — N3941 Urge incontinence: Secondary | ICD-10-CM | POA: Diagnosis not present

## 2024-01-16 LAB — BLADDER SCAN AMB NON-IMAGING: PVR: 0 WU

## 2024-01-16 LAB — URINALYSIS, COMPLETE
Bilirubin, UA: NEGATIVE
Glucose, UA: NEGATIVE
Ketones, UA: NEGATIVE
Nitrite, UA: NEGATIVE
Protein,UA: NEGATIVE
Specific Gravity, UA: 1.01 (ref 1.005–1.030)
Urobilinogen, Ur: 0.2 mg/dL (ref 0.2–1.0)
pH, UA: 7 (ref 5.0–7.5)

## 2024-01-16 LAB — MICROSCOPIC EXAMINATION: RBC, Urine: >30 /HPF — AB (ref 0–2)

## 2024-01-16 MED ORDER — SOLIFENACIN SUCCINATE 10 MG PO TABS: 10.0000 mg | ORAL_TABLET | Freq: Every day | ORAL | 2 refills | Status: DC

## 2024-01-16 NOTE — Progress Notes (Signed)
 01/16/2024 9:59 AM   Gail Mullins 04/02/1986 [id number]  CC: Chief Complaint  Patient presents with   Urinary Incontinence   HPI: Gail Mullins is a 38 y.o. female with PMH OAB wet who previously stopped Gemtesa due to cost who presents today for symptom recheck on oxybutynin ER 10 mg.   Today she reports no change in her voiding symptoms on oxybutynin.  She notes that her urge incontinence tends to worsen in the 1 to 2 days prior to her period.  She is currently menstruating.  She denies flank pain, nausea, vomiting, menorrhagia, or metrorrhagia.  She had no constipation, dry mouth, or dry eye on oxybutynin.  She does not have a gynecologist but would like a referral for one.  In-office UA today positive for plus blood and trace leukocytes; urine microscopy with 6-10 WBCs/HPF, >30 RBCs/HPF, and moderate bacteria. PVR 0mL.  PMH: Past Medical History:  Diagnosis Date   Alcohol abuse    Anxiety    Depression     Surgical History: Past Surgical History:  Procedure Laterality Date   ESOPHAGOGASTRODUODENOSCOPY N/A 08/15/2019   Procedure: ESOPHAGOGASTRODUODENOSCOPY (EGD);  Surgeon: Toledo, Boykin Nearing, MD;  Location: [facility] ENDOSCOPY;  Service: Gastroenterology;  Laterality: N/A;    Home Medications:  Allergies as of 01/16/2024   No Known Allergies      Medication List        Accurate as of January 16, 2024  9:59 AM. If you have any questions, ask your nurse or doctor.          mirtazapine 30 MG tablet Commonly known as: REMERON Take 30 mg by mouth at bedtime.   Nurtec 75 MG Tbdp Generic drug: Rimegepant Sulfate TAKE 1 TABLET BY MOUTH EVERY OTHERY DAY AS DIRECTED   ondansetron 4 MG disintegrating tablet Commonly known as: ZOFRAN-ODT Take 1 tablet (4 mg total) by mouth every 8 (eight) hours as needed for nausea or vomiting.   OVER THE COUNTER MEDICATION Take 1 tablet by mouth daily. Blood Builder   oxybutynin 10 MG 24 hr tablet Commonly known as:  DITROPAN-XL Take 1 tablet (10 mg total) by mouth daily.   SUMAtriptan 50 MG tablet Commonly known as: IMITREX TAKE 1 TABLET BY MOUTH EVERY 2 HOURS AS NEEDED MIGRAINE MAY REPEAT IN 2 HOURSIF NEEDED   Vraylar 4.5 MG Caps Generic drug: Cariprazine HCl Take 4.5 capsules by mouth daily.        Allergies:  No Known Allergies  Family History: Family History  Problem Relation Age of Onset   Breast cancer Neg Hx     Social History:   reports that she has quit smoking. Her smoking use included cigarettes. She has a 7.5 pack-year smoking history. She has been exposed to tobacco smoke. She has never used smokeless tobacco. She reports that she does not currently use alcohol. She reports that she does not currently use drugs.  Physical Exam: BP 122/77   Pulse 82   Ht 5\' 7"  (1.702 m)   Wt 182 lb (82.6 kg)   BMI 28.51 kg/m   Constitutional:  Alert and oriented, no acute distress, nontoxic appearing HEENT: [place], AT Cardiovascular: No clubbing, cyanosis, or edema Respiratory: Normal respiratory effort, no increased work of breathing Skin: No rashes, bruises or suspicious lesions Neurologic: Grossly intact, no focal deficits, moving all 4 extremities Psychiatric: Normal mood and affect  Laboratory Data: Results for orders placed or performed in visit on 01/16/24  Microscopic Examination   Collection Time: 01/16/24  9:45 AM  Urine  Result Value Ref Range   WBC, UA 6-10 (A) 0 - 5 /hpf   RBC, Urine >30 (A) 0 - 2 /hpf   Epithelial Cells (non renal) 0-10 0 - 10 /hpf   Crystals Present (A) N/A   Crystal Type Amorphous Sediment N/A   Bacteria, UA Moderate (A) None seen/Few  Urinalysis, Complete   Collection Time: 01/16/24  9:45 AM  Result Value Ref Range   Specific Gravity, UA 1.010 1.005 - 1.030   pH, UA 7.0 5.0 - 7.5   Color, UA Yellow Yellow   Appearance Ur Hazy (A) Clear   Leukocytes,UA Trace (A) Negative   Protein,UA Negative Negative/Trace   Glucose, UA Negative Negative    Ketones, UA Negative Negative   RBC, UA 3+ (A) Negative   Bilirubin, UA Negative Negative   Urobilinogen, Ur 0.2 0.2 - 1.0 mg/dL   Nitrite, UA Negative Negative   Microscopic Examination See below:   Bladder Scan (Post Void Residual) in office   Collection Time: 01/16/24  9:55 AM  Result Value Ref Range   PVR 0.0 WU   Assessment & Plan:   1. Urgency incontinence (Primary) No symptomatic improvement on oxybutynin, though she did tolerate antimuscarinics well without anticholinergic side effects.  Will switch her to Vesicare and have her keep scheduled follow-up with Dr. Sherron Monday next month for symptom recheck.  I suspect menstrual contamination and her UA today, though will send for culture to rule out infection.  Also submitting referral for GYN per patient preference. - Urinalysis, Complete - Bladder Scan (Post Void Residual) in office - solifenacin (VESICARE) 10 MG tablet; Take 1 tablet (10 mg total) by mouth daily.  Dispense: 30 tablet; Refill: 2 - Ambulatory referral to Obstetrics / Gynecology - CULTURE, URINE COMPREHENSIVE   Return for Keep follow-up as scheduled.  Carman Ching, PA-C  [facility] [place] [address], [address] [place], [place] [address] [phone]

## 2024-01-20 LAB — CULTURE, URINE COMPREHENSIVE

## 2024-01-25 NOTE — Patient Instructions (Incomplete)
 Be Involved in Caring For Your Health:  Taking Medications When medications are taken as directed, they can greatly improve your health. But if they are not taken as prescribed, they may not work. In some cases, not taking them correctly can be harmful. To help ensure your treatment remains effective and safe, understand your medications and how to take them. Bring your medications to each visit for review by your provider.  Your lab results, notes, and after visit summary will be available on My Chart. We strongly encourage you to use this feature. If lab results are abnormal the clinic will contact you with the appropriate steps. If the clinic does not contact you assume the results are satisfactory. You can always view your results on My Chart. If you have questions regarding your health or results, please contact the clinic during office hours. You can also ask questions on My Chart.  We at [facility] are grateful that you chose us  to provide your care. We strive to provide evidence-based and compassionate care and are always looking for feedback. If you get a survey from the clinic please complete this so we can hear your opinions.  Managing Depression, Adult Depression is a mental health condition that affects your thoughts, feelings, and actions. Being diagnosed with depression can bring you relief if you did not know why you have felt or behaved a certain way. It could also leave you feeling overwhelmed. Finding ways to manage your symptoms can help you feel more positive about your future. How to manage lifestyle changes Being depressed is difficult. Depression can increase the level of everyday stress. Stress can make depression symptoms worse. You may believe your symptoms cannot be managed or will never improve. However, there are many things you can try to help manage your symptoms. There is hope. Managing stress  Stress is your body's reaction to life changes and events,  both good and bad. Stress can add to your feelings of depression. Learning to manage your stress can help lessen your feelings of depression. Try some of the following approaches to reducing your stress (stress reduction techniques): Listen to music that you enjoy and that inspires you. Try using a meditation app or take a meditation class. Develop a practice that helps you connect with your spiritual self. Walk in nature, pray, or go to a place of worship. Practice deep breathing. To do this, inhale slowly through your nose. Pause at the top of your inhale for a few seconds and then exhale slowly, letting yourself relax. Repeat this three or four times. Practice yoga to help relax and work your muscles. Choose a stress reduction technique that works for you. These techniques take time and practice to develop. Set aside 5-15 minutes a day to do them. Therapists can offer training in these techniques. Do these things to help manage stress: Keep a journal. Know your limits. Set healthy boundaries for yourself and others, such as saying "no" when you think something is too much. Pay attention to how you react to certain situations. You may not be able to control everything, but you can change your reaction. Add humor to your life by watching funny movies or shows. Make time for activities that you enjoy and that relax you. Spend less time using electronics, especially at night before bed. The light from screens can make your brain think it is time to get up rather than go to bed.  Medicines Medicines, such as antidepressants, are often a part of  treatment for depression. Talk with your pharmacist or health care provider about all the medicines, supplements, and herbal products that you take, their possible side effects, and what medicines and other products are safe to take together. Make sure to report any side effects you may have to your health care provider. Relationships Your health care  provider may suggest family therapy, couples therapy, or individual therapy as part of your treatment. How to recognize changes Everyone responds differently to treatment for depression. As you recover from depression, you may start to: Have more interest in doing activities. Feel more hopeful. Have more energy. Eat a more regular amount of food. Have better mental focus. It is important to recognize if your depression is not getting better or is getting worse. The symptoms you had in the beginning may return, such as: Feeling tired. Eating too much or too little. Sleeping too much or too little. Feeling restless, agitated, or hopeless. Trouble focusing or making decisions. Having unexplained aches and pains. Feeling irritable, angry, or aggressive. If you or your family members notice these symptoms coming back, let your health care provider know right away. Follow these instructions at home: Activity Try to get some form of exercise each day, such as walking. Try yoga, mindfulness, or other stress reduction techniques. Participate in group activities if you are able. Lifestyle Get enough sleep. Cut down on or stop using caffeine, tobacco, alcohol, and any other harmful substances. Eat a healthy diet that includes plenty of vegetables, fruits, whole grains, low-fat dairy products, and lean protein. Limit foods that are high in solid fats, added sugar, or salt (sodium). General instructions Take over-the-counter and prescription medicines only as told by your health care provider. Keep all follow-up visits. It is important for your health care provider to check on your mood, behavior, and medicines. Your health care provider may need to make changes to your treatment. Where to find support Talking to others  Friends and family members can be sources of support and guidance. Talk to trusted friends or family members about your condition. Explain your symptoms and let them know that you  are working with a health care provider to treat your depression. Tell friends and family how they can help. Finances Find mental health providers that fit with your financial situation. Talk with your health care provider if you are worried about access to food, housing, or medicine. Call your insurance company to learn about your co-pays and prescription plan. Where to find more information You can find support in your area from: Anxiety and Depression Association of America (ADAA): adaa.org Mental Health America: mentalhealthamerica.net [employer] on Mental Illness: nami.org Contact a health care provider if: You stop taking your antidepressant medicines, and you have any of these symptoms: Nausea. Headache. Light-headedness. Chills and body aches. Not being able to sleep (insomnia). You or your friends and family think your depression is getting worse. Get help right away if: You have thoughts of hurting yourself or others. Get help right away if you feel like you may hurt yourself or others, or have thoughts about taking your own life. Go to your nearest emergency room or: Call 911. Call the National Suicide Prevention Lifeline at (260) 124-8722 or 988. This is open 24 hours a day. Text the Crisis Text Line at (716) 510-5612. This information is not intended to replace advice given to you by your health care provider. Make sure you discuss any questions you have with your health care provider. Document Revised: 01/29/2022 Document Reviewed:  01/29/2022 Elsevier Patient Education  2024 [employer].

## 2024-01-30 ENCOUNTER — Ambulatory Visit (INDEPENDENT_AMBULATORY_CARE_PROVIDER_SITE_OTHER): Payer: Self-pay | Admitting: Nurse Practitioner

## 2024-01-30 ENCOUNTER — Encounter: Payer: Self-pay | Admitting: Nurse Practitioner

## 2024-01-30 ENCOUNTER — Telehealth: Payer: Self-pay | Admitting: Nurse Practitioner

## 2024-01-30 VITALS — BP 110/71 | HR 84 | Temp 98.7°F | Wt 179.0 lb

## 2024-01-30 DIAGNOSIS — F4310 Post-traumatic stress disorder, unspecified: Secondary | ICD-10-CM | POA: Diagnosis not present

## 2024-01-30 DIAGNOSIS — G43E09 Chronic migraine with aura, not intractable, without status migrainosus: Secondary | ICD-10-CM | POA: Diagnosis not present

## 2024-01-30 DIAGNOSIS — F321 Major depressive disorder, single episode, moderate: Secondary | ICD-10-CM | POA: Diagnosis not present

## 2024-01-30 MED ORDER — NURTEC 75 MG PO TBDP: 1.0000 | ORAL_TABLET | ORAL | 1 refills | Status: DC

## 2024-01-30 NOTE — Assessment & Plan Note (Signed)
Chronic, ongoing.  Refer to depression plan of care.

## 2024-01-30 NOTE — Assessment & Plan Note (Signed)
 Chronic, stable.  Drastic reduction in migraines with Nurtec as maintenance. Did not improve with Imitrex , Maxalt , Gabapentin , Amitriptyline in past.  No neuro red flags on exam.  She will continue Imitrex  for acute.  Would avoid BB or ACE/ARB due to baseline lower BP.  Due to mental health medications limits what can be used.  Continue Nurtec every other day.  We discussed injectables, but she prefers a pill.  May need imaging in future if worsening headaches present -- MRI brain and possibly imaging cervical spine.

## 2024-01-30 NOTE — Addendum Note (Signed)
 Addended by: Yovan Leeman T on: 01/30/2024 03:33 PM   Modules accepted: Orders

## 2024-01-30 NOTE — Progress Notes (Signed)
 BP 110/71   Pulse 84   Temp 98.7 F (37.1 C) (Oral)   Wt 179 lb (81.2 kg)   SpO2 98%   BMI 28.04 kg/m    Subjective:    Patient ID: Gail Mullins, female    DOB: 1985-10-25, 38 y.o.   MRN: [id number]  HPI: Gail Mullins is a 37 y.o. female  Chief Complaint  Patient presents with   Depression   Migraine   DEPRESSION Follows with psychiatry. Taking Mirtazapine and Vraylar. Feels mood is better with Mirtazapine.   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 Trintellix caused pancreatitis. Mood status: stable Satisfied with current treatment?: yes Symptom severity: moderate  Duration of current treatment : chronic Side effects: no Medication compliance: good compliance Psychotherapy/counseling: yes current Depressed mood: improving Anxious mood: improving Anhedonia: no Significant weight loss or gain: no Insomnia: none Fatigue: no Feelings of worthlessness or guilt:  a little bit Impaired concentration/indecisiveness:  better Suicidal ideations: no Hopelessness: no Crying spells: no    01/30/2024    9:58 AM 12/16/2023    3:24 PM 08/01/2023   10:00 AM 07/15/2023    8:06 AM 07/04/2023    9:47 AM  Depression screen PHQ 2/9  Decreased Interest 0 1 1 2 2   Down, Depressed, Hopeless 0 2 1 2 2   PHQ - 2 Score 0 3 2 4 4   Altered sleeping 0 2 2 1 1   Tired, decreased energy 0 2 2 2 3   Change in appetite 0 1 2 2 2   Feeling bad or failure about yourself  1 1 1 2 1   Trouble concentrating 0 1 2 2 3   Moving slowly or fidgety/restless 0 1 0 1 1  Suicidal thoughts 0 0 0 0 0  PHQ-9 Score 1 11 11 14 15   Difficult doing work/chores Not difficult at all Not difficult at all Not difficult at all Very difficult Not difficult at all       01/30/2024    9:59 AM 12/16/2023    3:25 PM 08/01/2023   10:00 AM 07/15/2023    8:06 AM  GAD 7 : Generalized Anxiety Score  Nervous, Anxious, on Edge 1 1 2 1   Control/stop worrying 1 1 1 2   Worry too much - different things 1 2  1 1   Trouble relaxing 1 1 1 2   Restless 1 1 1 2   Easily annoyed or irritable 1 1 0 1  Afraid - awful might happen 1 1 1 1   Total GAD 7 Score 7 8 7 10   Anxiety Difficulty Not difficult at all Not difficult at all Somewhat difficult Very difficult   MIGRAINES Continues on Nurtec and Imitrex .  Nurtec works well for her.  Had some recent headaches when was taking Trintellix, but these have improved. Duration: chronic Frequency: intermittent Location: right frontal aspect Headache duration: Radiation: no Time of day headache occurs: varies Alleviating factors: Nurtec Aggravating factors: unknown Headache status at time of visit: asymptomatic Treatments attempted: multiple medications   Aura: no Nausea:  no Vomiting: no Photophobia:  yes Phonophobia:  yes Effect on social functioning:  no Numbers of missed days of school/work each month: 0 Confusion:  no Gait disturbance/ataxia:  no Behavioral changes:  no Fevers:  no   Relevant past medical, surgical, family and social history reviewed and updated as indicated. Interim medical history since our last visit reviewed. Allergies and medications reviewed and updated.  Review of Systems  Constitutional:  Negative for activity change, appetite change,  diaphoresis, fatigue and fever.  Respiratory:  Negative for cough, chest tightness, shortness of breath and wheezing.   Cardiovascular:  Negative for chest pain, palpitations and leg swelling.  Gastrointestinal: Negative.   Neurological: Negative.   Psychiatric/Behavioral: Negative.     Per HPI unless specifically indicated above     Objective:    BP 110/71   Pulse 84   Temp 98.7 F (37.1 C) (Oral)   Wt 179 lb (81.2 kg)   SpO2 98%   BMI 28.04 kg/m   Wt Readings from Last 3 Encounters:  01/30/24 179 lb (81.2 kg)  01/16/24 182 lb (82.6 kg)  12/16/23 181 lb (82.1 kg)    Physical Exam Vitals and nursing note reviewed.  Constitutional:      General: She is awake. She is  not in acute distress.    Appearance: She is well-developed and well-groomed. She is not ill-appearing or toxic-appearing.  HENT:     Head: Normocephalic.     Right Ear: Hearing and external ear normal.     Left Ear: Hearing and external ear normal.  Eyes:     General: Lids are normal.        Right eye: No discharge.        Left eye: No discharge.     Conjunctiva/sclera: Conjunctivae normal.     Pupils: Pupils are equal, round, and reactive to light.  Neck:     Thyroid: No thyromegaly.     Vascular: No carotid bruit.  Cardiovascular:     Rate and Rhythm: Normal rate and regular rhythm.     Heart sounds: Normal heart sounds. No murmur heard.    No gallop.  Pulmonary:     Effort: Pulmonary effort is normal. No accessory muscle usage or respiratory distress.     Breath sounds: Normal breath sounds.  Abdominal:     General: Bowel sounds are normal. There is no distension.     Palpations: Abdomen is soft.     Tenderness: There is no abdominal tenderness.  Musculoskeletal:     Cervical back: Normal range of motion and neck supple.     Right lower leg: No edema.     Left lower leg: No edema.  Lymphadenopathy:     Cervical: No cervical adenopathy.  Skin:    General: Skin is warm and dry.  Neurological:     Mental Status: She is alert and oriented to person, place, and time.     Deep Tendon Reflexes: Reflexes are normal and symmetric.     Reflex Scores:      Brachioradialis reflexes are 2+ on the right side and 2+ on the left side.      Patellar reflexes are 2+ on the right side and 2+ on the left side. Psychiatric:        Attention and Perception: Attention normal.        Mood and Affect: Mood normal.        Speech: Speech normal.        Behavior: Behavior normal. Behavior is cooperative.        Thought Content: Thought content normal.     Results for orders placed or performed in visit on 01/16/24  Microscopic Examination   Collection Time: 01/16/24  9:45 AM   Urine   Result Value Ref Range   WBC, UA 6-10 (A) 0 - 5 /hpf   RBC, Urine >30 (A) 0 - 2 /hpf   Epithelial Cells (non renal) 0-10 0 - 10 /hpf  Crystals Present (A) N/A   Crystal Type Amorphous Sediment N/A   Bacteria, UA Moderate (A) None seen/Few  Urinalysis, Complete   Collection Time: 01/16/24  9:45 AM  Result Value Ref Range   Specific Gravity, UA 1.010 1.005 - 1.030   pH, UA 7.0 5.0 - 7.5   Color, UA Yellow Yellow   Appearance Ur Hazy (A) Clear   Leukocytes,UA Trace (A) Negative   Protein,UA Negative Negative/Trace   Glucose, UA Negative Negative   Ketones, UA Negative Negative   RBC, UA 3+ (A) Negative   Bilirubin, UA Negative Negative   Urobilinogen, Ur 0.2 0.2 - 1.0 mg/dL   Nitrite, UA Negative Negative   Microscopic Examination See below:   Bladder Scan (Post Void Residual) in office   Collection Time: 01/16/24  9:55 AM  Result Value Ref Range   PVR 0.0 WU  CULTURE, URINE COMPREHENSIVE   Collection Time: 01/16/24 11:22 AM   Specimen: Urine   UR  Result Value Ref Range   Urine Culture, Comprehensive Final report    Organism ID, Bacteria Comment       Assessment & Plan:   Problem List Items Addressed This Visit       Cardiovascular and Mediastinum   Chronic migraine with aura without status migrainosus, not intractable   Chronic, stable.  Drastic reduction in migraines with Nurtec as maintenance. Did not improve with Imitrex , Maxalt , Gabapentin , Amitriptyline in past.  No neuro red flags on exam.  She will continue Imitrex  for acute.  Would avoid BB or ACE/ARB due to baseline lower BP.  Due to mental health medications limits what can be used.  Continue Nurtec every other day.  We discussed injectables, but she prefers a pill.  May need imaging in future if worsening headaches present -- MRI brain and possibly imaging cervical spine.        Other   PTSD (post-traumatic stress disorder)   Chronic, ongoing.  Refer to depression plan of care.      Depression, major,  single episode, moderate (HCC) - Primary   Chronic, ongoing.  Followed by psychiatry.  Denies SI/HI today.  Will continue collaboration with psychiatry and therapy + regimen as ordered by them. Discussed with her asking psychiatrist about adding on Propranolol for current tremor, as suspect it may be side effect of Mirtazapine which is working well for her.  Propranolol may also assist anxiety.        Follow up plan: Return in about 6 months (around 07/31/2024) for Annual Physical.

## 2024-01-30 NOTE — Assessment & Plan Note (Signed)
 Chronic, ongoing.  Followed by psychiatry.  Denies SI/HI today.  Will continue collaboration with psychiatry and therapy + regimen as ordered by them. Discussed with her asking psychiatrist about adding on Propranolol for current tremor, as suspect it may be side effect of Mirtazapine which is working well for her.  Propranolol may also assist anxiety.

## 2024-01-30 NOTE — Telephone Encounter (Signed)
 Copied from CRM [id number]. Topic: Clinical - Prescription Issue >> Jan 30, 2024  2:15 PM Yolanda T wrote: Reason for CRM: Sam pharmacist from [facility] called stated the script for NURTEC 75 MG TBDP needs to be sent as 30 days supply as he will have to fill the script as 60 days and the copay would be double.

## 2024-02-02 ENCOUNTER — Other Ambulatory Visit: Payer: Self-pay

## 2024-02-02 MED ORDER — NURTEC 75 MG PO TBDP: 1.0000 | ORAL_TABLET | ORAL | 0 refills | Status: DC

## 2024-02-02 NOTE — Telephone Encounter (Signed)
 Copied from CRM [id number]. Topic: Clinical - Prescription Issue >> Jan 30, 2024  2:15 PM Yolanda T wrote: Reason for CRM: Sam pharmacist from [facility] called stated the script for NURTEC 75 MG TBDP needs to be sent as 30 days supply as he will have to fill the script as 60 days and the copay would be double. >> Feb 02, 2024  1:00 PM Emylou G wrote: Ethyl Hering w/[facility] please resend medication as 16 tabs.. day supply 30.. otherwise they can only do 8 tabs.Aaron Aas

## 2024-02-11 ENCOUNTER — Encounter: Payer: Self-pay | Admitting: Nurse Practitioner

## 2024-02-11 ENCOUNTER — Telehealth: Admitting: Nurse Practitioner

## 2024-02-11 DIAGNOSIS — F4310 Post-traumatic stress disorder, unspecified: Secondary | ICD-10-CM

## 2024-02-11 DIAGNOSIS — F321 Major depressive disorder, single episode, moderate: Secondary | ICD-10-CM | POA: Diagnosis not present

## 2024-02-11 MED ORDER — MIRTAZAPINE 45 MG PO TABS: 45.0000 mg | ORAL_TABLET | Freq: Every day | ORAL | 1 refills | Status: DC

## 2024-02-11 MED ORDER — PROPRANOLOL HCL 10 MG PO TABS
10.0000 mg | ORAL_TABLET | Freq: Three times a day (TID) | ORAL | 1 refills | Status: DC | PRN
Start: 2024-02-11 — End: 2024-03-31

## 2024-02-11 NOTE — Progress Notes (Signed)
 There were no vitals taken for this visit.   Subjective:    Patient ID: Gail Mullins, female    DOB: [birth year], 38 y.o.   MRN: [id number]  HPI: Gail Mullins is a 38 y.o. female  Chief Complaint  Patient presents with   Anxiety   Virtual Visit via Video Note  I connected with Gail Mullins on 2024-02-25 at  1:20 PM EDT by a video enabled telemedicine application and verified that I am speaking with the correct person using two identifiers.  Location: Patient: home Provider: work   I discussed the limitations of evaluation and management by telemedicine and the availability of in person appointments. The patient expressed understanding and agreed to proceed. hom I discussed the assessment and treatment plan with the patient. The patient was provided an opportunity to ask questions and all were answered. The patient agreed with the plan and demonstrated an understanding of the instructions.   The patient was advised to call back or seek an in-person evaluation if the symptoms worsen or if the condition fails to improve as anticipated.  I provided 25 minutes of non-face-to-face time during this encounter.   Shakaya Bhullar T Maylynn Orzechowski, NP   ANXIETY/STRESS Follows with psychiatry, but she is currently in hospital due to hip surgery.  Unable to visit with her at present. Gail Mullins has had two bad panic attacks over the past week, she is not sure what triggered it. Having more anxiety over past week.  Taking Mirtazapine and Vraylar. Feels mood is better with Mirtazapine.   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 Trintellix caused pancreatitis. Duration:uncontrolled Anxious mood: yes  Excessive worrying: yes Irritability:  sometimes   Sweating: no Nausea: no Palpitations: occasional Hyperventilation: yes Panic attacks: yes as above Agoraphobia: no  Obscessions/compulsions: no Depressed mood: no    02/25/24    1:15 PM 01/30/2024    9:58 AM 12/16/2023    3:24  PM 08/01/2023   10:00 AM 07/15/2023    8:06 AM  Depression screen PHQ 2/9  Decreased Interest 0 0 1 1 2   Down, Depressed, Hopeless 1 0 2 1 2   PHQ - 2 Score 1 0 3 2 4   Altered sleeping 1 0 2 2 1   Tired, decreased energy 1 0 2 2 2   Change in appetite 0 0 1 2 2   Feeling bad or failure about yourself  1 1 1 1 2   Trouble concentrating 0 0 1 2 2   Moving slowly or fidgety/restless 0 0 1 0 1  Suicidal thoughts  0 0 0 0  PHQ-9 Score 4 1 11 11 14   Difficult doing work/chores Somewhat difficult Not difficult at all Not difficult at all Not difficult at all Very difficult  Anhedonia: no Weight changes: no Insomnia: sleep pattern has been off Hypersomnia: no Fatigue/loss of energy: yes Feelings of worthlessness: a little here and there Feelings of guilt: a little here and there Impaired concentration/indecisiveness: no Suicidal ideations: no  Crying spells: yes Recent Stressors/Life Changes: yes -- interacting with family and current country events   Relationship problems: no   Family stress: yes     Financial stress: no    Job stress: no    Recent death/loss: no     2024/02/25    1:18 PM 01/30/2024    9:59 AM 12/16/2023    3:25 PM 08/01/2023   10:00 AM  GAD 7 : Generalized Anxiety Score  Nervous, Anxious, on Edge 2 1 1 2   Control/stop worrying 2  1 1 1   Worry too much - different things 2 1 2 1   Trouble relaxing 2 1 1 1   Restless 2 1 1 1   Easily annoyed or irritable 1 1 1  0  Afraid - awful might happen 1 1 1 1   Total GAD 7 Score 12 7 8 7   Anxiety Difficulty Somewhat difficult Not difficult at all Not difficult at all Somewhat difficult      Relevant past medical, surgical, family and social history reviewed and updated as indicated. Interim medical history since our last visit reviewed. Allergies and medications reviewed and updated.  Review of Systems  Constitutional:  Negative for activity change, appetite change, diaphoresis, fatigue and fever.  Respiratory:  Negative for  cough, chest tightness, shortness of breath and wheezing.   Cardiovascular:  Negative for chest pain, palpitations and leg swelling.  Gastrointestinal: Negative.   Neurological: Negative.   Psychiatric/Behavioral:  Positive for decreased concentration and sleep disturbance. Negative for self-injury and suicidal ideas. The patient is nervous/anxious.     Per HPI unless specifically indicated above     Objective:    There were no vitals taken for this visit.  Wt Readings from Last 3 Encounters:  01/30/24 179 lb (81.2 kg)  01/16/24 182 lb (82.6 kg)  12/16/23 181 lb (82.1 kg)    Physical Exam Vitals and nursing note reviewed.  Constitutional:      General: She is awake. She is not in acute distress.    Appearance: She is well-developed. She is not ill-appearing.  HENT:     Head: Normocephalic.     Right Ear: Hearing normal.     Left Ear: Hearing normal.  Eyes:     General: Lids are normal.        Right eye: No discharge.        Left eye: No discharge.     Conjunctiva/sclera: Conjunctivae normal.  Pulmonary:     Effort: Pulmonary effort is normal. No accessory muscle usage or respiratory distress.  Musculoskeletal:     Cervical back: Normal range of motion.  Neurological:     Mental Status: She is alert and oriented to person, place, and time.  Psychiatric:        Attention and Perception: Attention normal.        Mood and Affect: Mood normal.        Behavior: Behavior normal. Behavior is cooperative.        Thought Content: Thought content normal.        Judgment: Judgment normal.     Results for orders placed or performed in visit on 01/16/24  Microscopic Examination   Collection Time: 01/16/24  9:45 AM   Urine  Result Value Ref Range   WBC, UA 6-10 (A) 0 - 5 /hpf   RBC, Urine >30 (A) 0 - 2 /hpf   Epithelial Cells (non renal) 0-10 0 - 10 /hpf   Crystals Present (A) N/A   Crystal Type Amorphous Sediment N/A   Bacteria, UA Moderate (A) None seen/Few  Urinalysis,  Complete   Collection Time: 01/16/24  9:45 AM  Result Value Ref Range   Specific Gravity, UA 1.010 1.005 - 1.030   pH, UA 7.0 5.0 - 7.5   Color, UA Yellow Yellow   Appearance Ur Hazy (A) Clear   Leukocytes,UA Trace (A) Negative   Protein,UA Negative Negative/Trace   Glucose, UA Negative Negative   Ketones, UA Negative Negative   RBC, UA 3+ (A) Negative   Bilirubin, UA  Negative Negative   Urobilinogen, Ur 0.2 0.2 - 1.0 mg/dL   Nitrite, UA Negative Negative   Microscopic Examination See below:   Bladder Scan (Post Void Residual) in office   Collection Time: 01/16/24  9:55 AM  Result Value Ref Range   PVR 0.0 WU  CULTURE, URINE COMPREHENSIVE   Collection Time: 01/16/24 11:22 AM   Specimen: Urine   UR  Result Value Ref Range   Urine Culture, Comprehensive Final report    Organism ID, Bacteria Comment       Assessment & Plan:   Problem List Items Addressed This Visit       Other   PTSD (post-traumatic stress disorder)   Chronic, ongoing.  Refer to depression plan of care.      Relevant Medications   mirtazapine (REMERON) 45 MG tablet   Depression, major, single episode, moderate (HCC) - Primary   Chronic, ongoing.  Followed by psychiatry, but they are currently unable to see her as they are in the hospital.  Denies SI/HI today.  Will continue collaboration with psychiatry and therapy, but will adjust medication for now until can see psychiatrist - increase Mirtazapine 45 MG and add Propranolol 10 MG TID PRN. This may also help her occasional tremors in hands which could be related to either Mirtazapine or Vraylar.      Relevant Medications   mirtazapine (REMERON) 45 MG tablet     Follow up plan: Return if symptoms worsen or fail to improve, for to follow-up with psychiatry.

## 2024-02-11 NOTE — Assessment & Plan Note (Signed)
Chronic, ongoing.  Refer to depression plan of care.

## 2024-02-11 NOTE — Patient Instructions (Signed)
 Managing Anxiety, Adult  After being diagnosed with anxiety, you may be relieved to know why you have felt or behaved a certain way. You may also feel overwhelmed about the treatment ahead and what it will mean for your life. With care and support, you can manage your anxiety.  How to manage lifestyle changes  Understanding the difference between stress and anxiety  Although stress can play a role in anxiety, it is not the same as anxiety. Stress is your body's reaction to life changes and events, both good and bad. Stress is often caused by something external, such as a deadline, test, or competition. It normally goes away after the event has ended and will last just a few hours. But, stress can be ongoing and can lead to more than just stress.  Anxiety is caused by something internal, such as imagining a terrible outcome or worrying that something will go wrong that will greatly upset you. Anxiety often does not go away even after the event is over, and it can become a long-term (chronic) worry.  Lowering stress and anxiety    Talk with your health care provider or a counselor to learn more about lowering anxiety and stress. They may suggest tension-reduction techniques, such as:  Music. Spend time creating or listening to music that you enjoy and that inspires you.  Mindfulness-based meditation. Practice being aware of your normal breaths while not trying to control your breathing. It can be done while sitting or walking.  Centering prayer. Focus on a word, phrase, or sacred image that means something to you and brings you peace.  Deep breathing. Expand your stomach and inhale slowly through your nose. Hold your breath for 3-5 seconds. Then breathe out slowly, letting your stomach muscles relax.  Self-talk. Learn to notice and spot thought patterns that lead to anxiety reactions. Change those patterns to thoughts that feel peaceful.  Muscle relaxation. Take time to tense muscles and then relax them.  Choose a  tension-reduction technique that fits your lifestyle and personality. These techniques take time and practice. Set aside 5-15 minutes a day to do them. Specialized therapists can offer counseling and training in these techniques. The training to help with anxiety may be covered by some insurance plans.  Other things you can do to manage stress and anxiety include:  Keeping a stress diary. This can help you learn what triggers your reaction and then learn ways to manage your response.  Thinking about how you react to certain situations. You may not be able to control everything, but you can control your response.  Making time for activities that help you relax and not feeling guilty about spending your time in this way.  Doing visual imagery. This involves imagining or creating mental pictures to help you relax.  Practicing yoga. Through yoga poses, you can lower tension and relax.     Medicines  Medicines for anxiety include:  Antidepressant medicines. These are usually prescribed for long-term daily control.  Anti-anxiety medicines. These may be added in severe cases, especially when panic attacks occur.  When used together, medicines, psychotherapy, and tension-reduction techniques may be the most effective treatment.  Relationships  Relationships can play a big part in helping you recover. Spend more time connecting with trusted friends and family members. Think about going to couples counseling if you have a partner, taking family education classes, or going to family therapy. Therapy can help you and others better understand your anxiety.  How to recognize changes in  your anxiety  Everyone responds differently to treatment for anxiety. Recovery from anxiety happens when symptoms lessen and stop interfering with your daily life at home or work. This may mean that you will start to:  Have better concentration and focus. Worry will interfere less in your daily thinking.  Sleep better.  Be less irritable.  Have  more energy.  Have improved memory.  Try to recognize when your condition is getting worse. Contact your provider if your symptoms interfere with home or work and you feel like your condition is not improving.  Follow these instructions at home:  Activity  Exercise. Adults should:  Exercise for at least 150 minutes each week. The exercise should increase your heart rate and make you sweat (moderate-intensity exercise).  Do strengthening exercises at least twice a week.  Get the right amount and quality of sleep. Most adults need 7-9 hours of sleep each night.  Lifestyle    Eat a healthy diet that includes plenty of vegetables, fruits, whole grains, low-fat dairy products, and lean protein.  Do not eat a lot of foods that are high in fats, added sugars, or salt (sodium).  Make choices that simplify your life.  Do not use any products that contain nicotine or tobacco. These products include cigarettes, chewing tobacco, and vaping devices, such as e-cigarettes. If you need help quitting, ask your provider.  Avoid caffeine, alcohol, and certain over-the-counter cold medicines. These may make you feel worse. Ask your pharmacist which medicines to avoid.  General instructions  Take over-the-counter and prescription medicines only as told by your provider.  Keep all follow-up visits. This is to make sure you are managing your anxiety well or if you need more support.  Where to find support  You can get help and support from:  Self-help groups.  Online and [employer].  A trusted spiritual leader.  Couples counseling.  Family education classes.  Family therapy.  Where to find more information  You may find that joining a support group helps you deal with your anxiety. The following sources can help you find counselors or support groups near you:  Mental Health America: mentalhealthamerica.net  Anxiety and Depression Association of [place] (ADAA): adaa.org  [employer] Mental Illness (NAMI):  nami.org  Contact a health care provider if:  You have a hard time staying focused or finishing tasks.  You spend many hours a day feeling worried about everyday life.  You are very tired because you cannot stop worrying.  You start to have headaches or often feel tense.  You have chronic nausea or diarrhea.  Get help right away if:  Your heart feels like it is racing.  You have shortness of breath.  You have thoughts of hurting yourself or others.  Get help right away if you feel like you may hurt yourself or others, or have thoughts about taking your own life. Go to your nearest emergency room or:  Call 911.  Call the National Suicide Prevention Lifeline at 2043122231 or 988. This is open 24 hours a day.  Text the Crisis Text Line at (832)170-7952.  This information is not intended to replace advice given to you by your health care provider. Make sure you discuss any questions you have with your health care provider.  Document Revised: 07/02/2022 Document Reviewed: 01/14/2021  Elsevier Patient Education  2024 [employer].

## 2024-02-11 NOTE — Assessment & Plan Note (Signed)
 Chronic, ongoing.  Followed by psychiatry, but they are currently unable to see her as they are in the hospital.  Denies SI/HI today.  Will continue collaboration with psychiatry and therapy, but will adjust medication for now until can see psychiatrist - increase Mirtazapine 45 MG and add Propranolol 10 MG TID PRN. This may also help her occasional tremors in hands which could be related to either Mirtazapine or Vraylar.

## 2024-02-16 ENCOUNTER — Telehealth: Payer: Self-pay

## 2024-02-16 MED ORDER — NURTEC 75 MG PO TBDP: 1.0000 | ORAL_TABLET | ORAL | 0 refills | Status: AC

## 2024-02-16 NOTE — Telephone Encounter (Signed)
 Copied from CRM [id number]. Topic: Clinical - Prescription Issue >> Feb 16, 2024  2:07 PM Oddis Bench wrote: Reason for CRM: Pharmacist is calling to have the nordac needs to be 16 has to say at bottom ext day supply 30 or it will not be filled.

## 2024-02-23 ENCOUNTER — Ambulatory Visit: Admitting: Urology

## 2024-03-16 ENCOUNTER — Other Ambulatory Visit: Payer: Self-pay | Admitting: Physician Assistant

## 2024-03-16 DIAGNOSIS — N3941 Urge incontinence: Secondary | ICD-10-CM

## 2024-03-23 ENCOUNTER — Telehealth: Payer: Self-pay | Admitting: Nurse Practitioner

## 2024-03-23 ENCOUNTER — Other Ambulatory Visit: Payer: Self-pay | Admitting: Nurse Practitioner

## 2024-03-23 MED ORDER — NURTEC 75 MG PO TBDP: ORAL_TABLET | ORAL | 2 refills | Status: DC

## 2024-03-23 NOTE — Telephone Encounter (Signed)
 NURTEC 75 MG not on current list, routing for review.

## 2024-03-23 NOTE — Telephone Encounter (Unsigned)
 Copied from CRM [id number]. Topic: Clinical - Medication Refill >> Mar 23, 2024 11:54 AM Crispin Dolphin wrote: Medication: NURTEC 75 MG TBDP Qty 16 1 tablet every other day for 30 day not every day.   Has the patient contacted their pharmacy? Yes - pharmacy called to request tried to submit electronically but would not go through  (Agent: If no, request that the patient contact the pharmacy for the refill. If patient does not wish to contact the pharmacy document the reason why and proceed with request.) (Agent: If yes, when and what did the pharmacy advise?)  This is the patient's preferred pharmacy:  [facility] - [place], [place] - [address] [place] [address] Phone: [phone] Fax: [phone]  Is this the correct pharmacy for this prescription? Yes If no, delete pharmacy and type the correct one.   Has the prescription been filled recently? N/A  Is the patient out of the medication? Yes  Has the patient been seen for an appointment in the last year OR does the patient have an upcoming appointment? Yes  Can we respond through MyChart? No  Agent: Please be advised that Rx refills may take up to 3 business days. We ask that you follow-up with your pharmacy.

## 2024-03-24 ENCOUNTER — Encounter: Payer: Self-pay | Admitting: Nurse Practitioner

## 2024-03-24 ENCOUNTER — Other Ambulatory Visit: Payer: Self-pay

## 2024-03-24 MED ORDER — NURTEC 75 MG PO TBDP: ORAL_TABLET | ORAL | 2 refills | Status: DC

## 2024-03-24 NOTE — Telephone Encounter (Signed)
 Copied from CRM [id number]. Topic: Clinical - Medication Refill >> Mar 23, 2024 11:54 AM Crispin Dolphin wrote: Medication: NURTEC 75 MG TBDP Qty 16 1 tablet every other day for 30 day not every day.   Has the patient contacted their pharmacy? Yes - pharmacy called to request tried to submit electronically but would not go through  (Agent: If no, request that the patient contact the pharmacy for the refill. If patient does not wish to contact the pharmacy document the reason why and proceed with request.) (Agent: If yes, when and what did the pharmacy advise?)  This is the patient's preferred pharmacy:  [facility] - [place], [place] - [address] [place] [address] Phone: [phone] Fax: [phone]  Is this the correct pharmacy for this prescription? Yes If no, delete pharmacy and type the correct one.   Has the prescription been filled recently? N/A  Is the patient out of the medication? Yes  Has the patient been seen for an appointment in the last year OR does the patient have an upcoming appointment? Yes  Can we respond through MyChart? No  Agent: Please be advised that Rx refills may take up to 3 business days. We ask that you follow-up with your pharmacy. >> Mar 23, 2024  4:05 PM Felizardo Hotter wrote: Pharmacy called back stated script is not correct. It must read every other day for 30 day not every day.  Please call [facility] - [place], [place] - [address] [place] [address] Phone: [phone] Fax: [phone], for clarification. Please resend.

## 2024-03-24 NOTE — Telephone Encounter (Signed)
 RX t'd up with the directions advised by the pharmacy. Routing to provider to approve and send in.

## 2024-03-27 NOTE — Patient Instructions (Signed)
Pancreatitis Eating Plan Pancreatitis is when your pancreas gets irritated and swollen (inflamed). The pancreas is a small gland behind your stomach. It helps your body digest food and regulate your blood sugar. Pancreatitis can affect how your body digests food, especially foods with fat. You may also have other symptoms such as pain in your abdomen (abdominal pain) or nausea. When you have pancreatitis, following a low-fat eating plan may help you manage symptoms and recover faster. Work with your health care provider or a dietitian to create an eating plan that is right for you. What are tips for following this plan? Reading food labels Use the information on food labels to help keep track of how much fat you eat: Check the serving size. Look for the amount of total fat in grams (g) in one serving. Low-fat foods have 3 g of fat or less per serving. Fat-free foods have 0.5 g of fat or less per serving. Keep track of how much fat you eat based on how many servings you eat. For example, if you eat two servings, the amount of fat you eat will be twice what is listed on the label. Shopping  Buy low-fat or nonfat foods, such as: Fresh, frozen, or canned fruits and vegetables. Grains, including pasta, bread, and rice. Lean meat, poultry, fish, and other protein foods. Low-fat or nonfat dairy. Avoid buying bakery products and other sweets made with whole milk, butter, and eggs. Avoid buying snack foods with added fat, such as anything with butter or cheese flavoring. Cooking Remove skin from poultry, and remove extra fat from meat. Limit the amount of fat and oil you use to 6 tsp (30 mL) or less per day. Cook using low-fat methods, such as boiling, broiling, grilling, steaming, or baking. Use spray oil to cook. Add fat-free chicken broth to add flavor and moisture. Avoid adding cream to thicken soups or sauces. Use other thickeners such as corn starch or tomato paste. Meal planning  Eat a  low-fat diet as told by your dietitian. For most people, this means having no more than 55-65 g of fat each day. Eat small, frequent meals throughout the day. For example, you may have 5 or 6 small meals instead of 3 large meals. Drink enough fluid to keep your urine pale yellow. Do not drink alcohol. Talk to your health care provider if you need help stopping. Limit how much caffeine you have, including black coffee, black and green tea, soft drinks with caffeine, and energy drinks. Plan to include a variety of foods in your diet. Include fruits, vegetables, whole grains and lean proteins, and low-fat or nonfat dairy. You need a balanced diet for good overall health. General information Let your health care provider or dietitian know if you have unplanned weight loss on this eating plan. You may be told to follow a clear liquid or soft food diet when symptoms come back, which is called a flare. Talk with your health care provider about how to manage your diet during symptoms of a flare. Take any vitamins or supplements as told by your health care provider. You may need to take: Extra vitamins that dissolve in fat (are fat soluble), such as vitamins A, D, E, and K. Nutritional supplements. Work with a dietitian, especially if you have other conditions such as obesity, osteoporosis, or diabetes mellitus. Some people need extra treatments, such as: Pills or capsules to replace enzymes (oral pancreatic enzyme replacement therapy). Feedings through a tube in the stomach or intestine (  enteral feedings). What foods should I avoid? Fruits Fried fruits. Fruits served with butter or cream. Vegetables Fried vegetables. Vegetables cooked with butter, cheese, or cream. Grains Biscuits, waffles, donuts, pastries, and croissants. Pies and cookies. Butter-flavored popcorn. Regular crackers. Meats and other proteins Fatty cuts of meat. Poultry with skin. Organ meats. Precooked or cured meat, such as sausages  or meat loaves. Whole eggs. Nuts and nut butters. Dairy Whole and 2% milk. Whole milk yogurt. Whole milk ice cream. Cream and half-and-half. Cheese, such as cream cheese. Sour cream. Beverages Wine, beer, and liquor. The items listed above may not be a complete list of foods and beverages you should avoid. Contact a dietitian for more information. Summary Pancreatitis can affect how your body digests food, especially foods with fat. When you have pancreatitis, it is recommended that you follow a low-fat eating plan to help you recover faster and manage symptoms. Do not drink alcohol. Limit the amount of caffeine you have, and drink enough fluid to keep your urine pale yellow. This information is not intended to replace advice given to you by your health care provider. Make sure you discuss any questions you have with your health care provider. Document Revised: 09/12/2021 Document Reviewed: 09/12/2021 Elsevier Patient Education  2024 [employer]  

## 2024-03-31 ENCOUNTER — Ambulatory Visit (INDEPENDENT_AMBULATORY_CARE_PROVIDER_SITE_OTHER): Admitting: Nurse Practitioner

## 2024-03-31 VITALS — BP 102/66 | HR 59 | Temp 98.9°F | Ht 67.0 in | Wt 178.8 lb

## 2024-03-31 DIAGNOSIS — F4310 Post-traumatic stress disorder, unspecified: Secondary | ICD-10-CM | POA: Diagnosis not present

## 2024-03-31 DIAGNOSIS — F321 Major depressive disorder, single episode, moderate: Secondary | ICD-10-CM | POA: Diagnosis not present

## 2024-03-31 DIAGNOSIS — R1114 Bilious vomiting: Secondary | ICD-10-CM

## 2024-03-31 MED ORDER — ONDANSETRON 4 MG PO TBDP: 4.0000 mg | ORAL_TABLET | Freq: Three times a day (TID) | ORAL | 0 refills | Status: DC | PRN

## 2024-03-31 NOTE — Progress Notes (Signed)
 BP 102/66   Pulse (!) 59   Temp 98.9 F (37.2 C) (Oral)   Ht 5' 7 (1.702 m)   Wt 178 lb 12.8 oz (81.1 kg)   SpO2 98%   BMI 28.00 kg/m    Subjective:    Patient ID: Gail Mullins, female    DOB: [birth year], 38 y.o.   MRN: [id number]  HPI: Gail Mullins is a 38 y.o. female  Chief Complaint  Patient presents with   Nausea    Patient states she has still been experiencing the occasional nausea and vomiting. States this has been going on for a while now.    Emesis   ABDOMINAL ISSUES Initially started with episodes in February after starting Trintellix in January, was noted to have elevated pancreas labs at time and they stopped Trintellix with psychiatry, this helped improve her symptoms.  Recently psychiatry started her on Pristiq and Prazosin, a few weeks ago + increase her Propranolol .  Early last week symptoms returned. No pain with this. Having nausea.  She reports symptoms are similar to ones in February.  No alcohol use and no smoking.  Does vape, nicotine .  No drug use. In March pancreas labs had returned to normal. Duration:days Frequency: intermittent Alleviating factors: Zofran  Aggravating factors: unknown Status: stable Treatments attempted: Zofran  and diet changes Fever: no Nausea: yes Vomiting: yes Weight loss: no Decreased appetite: no Diarrhea: no Constipation: no Blood in stool: no Heartburn: a little bit Jaundice: no Rash: no Dysuria/urinary frequency: no Hematuria: no  Relevant past medical, surgical, family and social history reviewed and updated as indicated. Interim medical history since our last visit reviewed. Allergies and medications reviewed and updated.  Review of Systems  Constitutional:  Negative for activity change, appetite change, diaphoresis, fatigue and fever.  Respiratory:  Negative for cough, chest tightness, shortness of breath and wheezing.   Cardiovascular:  Negative for chest pain, palpitations and leg swelling.   Gastrointestinal:  Positive for nausea and vomiting. Negative for abdominal distention, abdominal pain, blood in stool, constipation and diarrhea.  Neurological: Negative.   Psychiatric/Behavioral: Negative.      Per HPI unless specifically indicated above     Objective:    BP 102/66   Pulse (!) 59   Temp 98.9 F (37.2 C) (Oral)   Ht 5' 7 (1.702 m)   Wt 178 lb 12.8 oz (81.1 kg)   SpO2 98%   BMI 28.00 kg/m   Wt Readings from Last 3 Encounters:  03/31/24 178 lb 12.8 oz (81.1 kg)  01/30/24 179 lb (81.2 kg)  01/16/24 182 lb (82.6 kg)    Physical Exam Vitals and nursing note reviewed.  Constitutional:      General: She is awake. She is not in acute distress.    Appearance: She is well-developed and well-groomed. She is not ill-appearing or toxic-appearing.  HENT:     Head: Normocephalic.     Right Ear: Hearing and external ear normal.     Left Ear: Hearing and external ear normal.   Eyes:     General: Lids are normal.        Right eye: No discharge.        Left eye: No discharge.     Conjunctiva/sclera: Conjunctivae normal.     Pupils: Pupils are equal, round, and reactive to light.   Neck:     Thyroid: No thyromegaly.     Vascular: No carotid bruit.   Cardiovascular:     Rate and Rhythm: Normal rate and  regular rhythm.     Heart sounds: Normal heart sounds. No murmur heard.    No gallop.  Pulmonary:     Effort: Pulmonary effort is normal. No accessory muscle usage or respiratory distress.     Breath sounds: Normal breath sounds.  Abdominal:     General: Bowel sounds are normal. There is no distension.     Palpations: Abdomen is soft.     Tenderness: There is no abdominal tenderness. There is no right CVA tenderness or left CVA tenderness.     Hernia: No hernia is present.   Musculoskeletal:     Cervical back: Normal range of motion and neck supple.     Right lower leg: No edema.     Left lower leg: No edema.  Lymphadenopathy:     Cervical: No cervical  adenopathy.   Skin:    General: Skin is warm and dry.   Neurological:     Mental Status: She is alert and oriented to person, place, and time.     Deep Tendon Reflexes: Reflexes are normal and symmetric.     Reflex Scores:      Brachioradialis reflexes are 2+ on the right side and 2+ on the left side.      Patellar reflexes are 2+ on the right side and 2+ on the left side.  Psychiatric:        Attention and Perception: Attention normal.        Mood and Affect: Mood normal.        Speech: Speech normal.        Behavior: Behavior normal. Behavior is cooperative.        Thought Content: Thought content normal.     Results for orders placed or performed in visit on 01/16/24  Microscopic Examination   Collection Time: 01/16/24  9:45 AM   Urine  Result Value Ref Range   WBC, UA 6-10 (A) 0 - 5 /hpf   RBC, Urine >30 (A) 0 - 2 /hpf   Epithelial Cells (non renal) 0-10 0 - 10 /hpf   Crystals Present (A) N/A   Crystal Type Amorphous Sediment N/A   Bacteria, UA Moderate (A) None seen/Few  Urinalysis, Complete   Collection Time: 01/16/24  9:45 AM  Result Value Ref Range   Specific Gravity, UA 1.010 1.005 - 1.030   pH, UA 7.0 5.0 - 7.5   Color, UA Yellow Yellow   Appearance Ur Hazy (A) Clear   Leukocytes,UA Trace (A) Negative   Protein,UA Negative Negative/Trace   Glucose, UA Negative Negative   Ketones, UA Negative Negative   RBC, UA 3+ (A) Negative   Bilirubin, UA Negative Negative   Urobilinogen, Ur 0.2 0.2 - 1.0 mg/dL   Nitrite, UA Negative Negative   Microscopic Examination See below:   Bladder Scan (Post Void Residual) in office   Collection Time: 01/16/24  9:55 AM  Result Value Ref Range   PVR 0.0 WU  CULTURE, URINE COMPREHENSIVE   Collection Time: 01/16/24 11:22 AM   Specimen: Urine   UR  Result Value Ref Range   Urine Culture, Comprehensive Final report    Organism ID, Bacteria Comment       Assessment & Plan:   Problem List Items Addressed This Visit        Digestive   Nausea and vomiting   Acute and present for a week.  Recently started new medications with psychiatry.  History of elevation in lipase and amylase with Trintellix.  Will  check levels today to see if any return to elevations.  Discussed with her we may need to obtain imaging if return to elevations noted.      Relevant Orders   CBC with Differential/Platelet   Comprehensive metabolic panel with GFR   TSH   Amylase   Lipase   Gamma GT     Other   PTSD (post-traumatic stress disorder)   Chronic, ongoing.  Refer to depression plan of care.      Depression, major, single episode, moderate (HCC) - Primary   Chronic, ongoing.  Followed by psychiatry, but they are currently unable to see her as they are in the hospital.  Denies SI/HI today.  Will continue collaboration with psychiatry and therapy. This may also help her occasional tremors in hands which could be related to either Mirtazapine  or Vraylar.        Follow up plan: Return in about 2 weeks (around 04/14/2024) for NAUSEA.

## 2024-03-31 NOTE — Assessment & Plan Note (Signed)
 Chronic, ongoing.  Followed by psychiatry, but they are currently unable to see her as they are in the hospital.  Denies SI/HI today.  Will continue collaboration with psychiatry and therapy. This may also help her occasional tremors in hands which could be related to either Mirtazapine  or Vraylar.

## 2024-03-31 NOTE — Assessment & Plan Note (Signed)
Chronic, ongoing.  Refer to depression plan of care.

## 2024-03-31 NOTE — Assessment & Plan Note (Signed)
 Acute and present for a week.  Recently started new medications with psychiatry.  History of elevation in lipase and amylase with Trintellix.  Will check levels today to see if any return to elevations.  Discussed with her we may need to obtain imaging if return to elevations noted.

## 2024-04-01 ENCOUNTER — Ambulatory Visit: Payer: Self-pay | Admitting: Nurse Practitioner

## 2024-04-01 LAB — COMPREHENSIVE METABOLIC PANEL WITH GFR
ALT: 9 IU/L (ref 0–32)
AST: 16 IU/L (ref 0–40)
Albumin: 4.8 g/dL (ref 3.9–4.9)
Alkaline Phosphatase: 58 IU/L (ref 44–121)
BUN/Creatinine Ratio: 9 (ref 9–23)
BUN: 7 mg/dL (ref 6–20)
Bilirubin Total: 0.2 mg/dL (ref 0.0–1.2)
CO2: 21 mmol/L (ref 20–29)
Calcium: 9.8 mg/dL (ref 8.7–10.2)
Chloride: 102 mmol/L (ref 96–106)
Creatinine, Ser: 0.75 mg/dL (ref 0.57–1.00)
Globulin, Total: 2.4 g/dL (ref 1.5–4.5)
Glucose: 89 mg/dL (ref 70–99)
Potassium: 4.9 mmol/L (ref 3.5–5.2)
Sodium: 138 mmol/L (ref 134–144)
Total Protein: 7.2 g/dL (ref 6.0–8.5)
eGFR: 104 mL/min/{1.73_m2} (ref >59)

## 2024-04-01 LAB — CBC WITH DIFFERENTIAL/PLATELET
Basophils Absolute: 0.1 10*3/uL (ref 0.0–0.2)
Basos: 1 %
EOS (ABSOLUTE): 0.4 10*3/uL (ref 0.0–0.4)
Eos: 4 %
Hematocrit: 38.8 % (ref 34.0–46.6)
Hemoglobin: 12.1 g/dL (ref 11.1–15.9)
Immature Grans (Abs): 0 10*3/uL (ref 0.0–0.1)
Immature Granulocytes: 0 %
Lymphocytes Absolute: 3.4 10*3/uL — ABNORMAL HIGH (ref 0.7–3.1)
Lymphs: 33 %
MCH: 29.9 pg (ref 26.6–33.0)
MCHC: 31.2 g/dL — ABNORMAL LOW (ref 31.5–35.7)
MCV: 96 fL (ref 79–97)
Monocytes Absolute: 0.4 10*3/uL (ref 0.1–0.9)
Monocytes: 4 %
Neutrophils Absolute: 6.2 10*3/uL (ref 1.4–7.0)
Neutrophils: 58 %
Platelets: 305 10*3/uL (ref 150–450)
RBC: 4.05 x10E6/uL (ref 3.77–5.28)
RDW: 13.1 % (ref 11.7–15.4)
WBC: 10.5 10*3/uL (ref 3.4–10.8)

## 2024-04-01 LAB — LIPASE: Lipase: 47 U/L (ref 14–72)

## 2024-04-01 LAB — TSH: TSH: 1.32 u[IU]/mL (ref 0.450–4.500)

## 2024-04-01 LAB — GAMMA GT: GGT: 14 IU/L (ref 0–60)

## 2024-04-01 LAB — AMYLASE: Amylase: 48 U/L (ref 31–110)

## 2024-04-01 NOTE — Progress Notes (Signed)
 Contacted via MyChart  Good afternoon Gail Mullins, your labs have returned: - CBC overall shows stable levels, very mild elevation in lymphocytes which we can monitor. - Remainder of labs are normal.  No signs of pancreatitis.  If the nausea continues I would see if it is medication related OR we can get you into GI if it is ongoing.  Any questions? Keep being stellar!!  Thank you for allowing me to participate in your care.  I appreciate you. Kindest regards, Todrick Siedschlag

## 2024-04-05 ENCOUNTER — Ambulatory Visit: Admitting: Urology

## 2024-04-10 NOTE — Patient Instructions (Signed)
 Be Involved in Caring For Your Health:  Taking Medications When medications are taken as directed, they can greatly improve your health. But if they are not taken as prescribed, they may not work. In some cases, not taking them correctly can be harmful. To help ensure your treatment remains effective and safe, understand your medications and how to take them. Bring your medications to each visit for review by your provider.  Your lab results, notes, and after visit summary will be available on My Chart. We strongly encourage you to use this feature. If lab results are abnormal the clinic will contact you with the appropriate steps. If the clinic does not contact you assume the results are satisfactory. You can always view your results on My Chart. If you have questions regarding your health or results, please contact the clinic during office hours. You can also ask questions on My Chart.  We at [facility] are grateful that you chose us  to provide your care. We strive to provide evidence-based and compassionate care and are always looking for feedback. If you get a survey from the clinic please complete this so we can hear your opinions.  Vomiting, Adult Vomiting is when stomach contents forcefully come out of the mouth. Many people notice nausea before vomiting. Vomiting can make you feel weak and cause you to become dehydrated. Dehydration can make you feel tired and thirsty, cause you to have a dry mouth, and decrease how often you urinate. Older adults and people who have other diseases or a weak body defense system (immune system) are at higher risk for dehydration. It is important to treat vomiting as told by your health care provider. Follow these instructions at home:  Watch your symptoms for any changes. Tell your health care provider about them. Eating and drinking     Follow these recommendations as told by your health care provider: Take an oral rehydration solution  (ORS). This is a drink that is sold at pharmacies and retail stores. Eat bland, easy-to-digest foods in small amounts as you are able. These foods include bananas, applesauce, rice, lean meats, toast, and crackers. Drink clear fluids slowly and in small amounts as you are able. Clear fluids include water, ice chips, low-calorie sports drinks, and fruit juice that has water added (diluted fruit juice). Avoid drinking fluids that contain a lot of sugar or caffeine, such as energy drinks, sports drinks, and soda. Avoid alcohol. Avoid spicy or fatty foods.  General instructions Wash your hands often using soap and water for at least 20 seconds. If soap and water are not available, use hand sanitizer. Make sure that everyone in your household washes their hands frequently. Take over-the-counter and prescription medicines only as told by your health care provider. Rest at home while you recover. Watch your condition for any changes. Keep all follow-up visits. This is important. Contact a health care provider if: Your vomiting gets worse. You have new symptoms. You have a fever. You cannot drink fluids without vomiting. You feel light-headed or dizzy. You have a headache. You have muscle cramps. You have a rash. You have pain while urinating. Get help right away if: You have pain in your chest, neck, arm, or jaw. Your heart is beating very quickly. You have trouble breathing or you are breathing very quickly. You feel extremely weak or you faint. Your skin feels cold and clammy. You feel confused. You have persistent vomiting. You have vomit that is bright red or looks like black  coffee grounds. You have stools (feces) that are bloody or black, or stools that look like tar. You have a severe headache, a stiff neck, or both. You have severe pain, cramping, or bloating in your abdomen. You have signs of dehydration, such as: Dark urine, very little urine, or no urine. Cracked lips. Dry  mouth. Sunken eyes. Sleepiness. Weakness. These symptoms may be an emergency. Get help right away. Call 911. Do not wait to see if the symptoms will go away. Do not drive yourself to the hospital. Summary Vomiting is when stomach contents forcefully come out of the mouth. Vomiting can cause you to become dehydrated. It is important to treat vomiting as told by your health care provider. Follow your health care provider's instructions about eating and drinking. Wash your hands often using soap and water for at least 20 seconds. If soap and water are not available, use hand sanitizer. Watch your condition for any changes and for signs of dehydration. Keep all follow-up visits. This is important. This information is not intended to replace advice given to you by your health care provider. Make sure you discuss any questions you have with your health care provider. Document Revised: 03/30/2021 Document Reviewed: 03/30/2021 Elsevier Patient Education  2024 [employer].

## 2024-04-14 ENCOUNTER — Ambulatory Visit (INDEPENDENT_AMBULATORY_CARE_PROVIDER_SITE_OTHER): Admitting: Nurse Practitioner

## 2024-04-14 ENCOUNTER — Encounter: Payer: Self-pay | Admitting: Nurse Practitioner

## 2024-04-14 VITALS — BP 125/78 | HR 78 | Temp 98.8°F | Ht 67.0 in | Wt 174.4 lb

## 2024-04-14 DIAGNOSIS — Z8719 Personal history of other diseases of the digestive system: Secondary | ICD-10-CM | POA: Diagnosis not present

## 2024-04-14 DIAGNOSIS — R112 Nausea with vomiting, unspecified: Secondary | ICD-10-CM

## 2024-04-14 MED ORDER — ONDANSETRON 4 MG PO TBDP: 4.0000 mg | ORAL_TABLET | Freq: Three times a day (TID) | ORAL | 3 refills | Status: AC | PRN

## 2024-04-14 MED ORDER — FAMOTIDINE 20 MG PO TABS: 20.0000 mg | ORAL_TABLET | Freq: Every day | ORAL | 4 refills | Status: DC

## 2024-04-14 NOTE — Progress Notes (Signed)
 BP 125/78   Pulse 78   Temp 98.8 F (37.1 C) (Oral)   Ht 5' 7 (1.702 m)   Wt 174 lb 6.4 oz (79.1 kg)   SpO2 98%   BMI 27.31 kg/m    Subjective:    Patient ID: Gail Mullins, female    DOB: August 03, 1986, 38 y.o.   MRN: [id number]  HPI: Gail Mullins is a 38 y.o. female  Chief Complaint  Patient presents with   Nausea   ABDOMINAL ISSUES Follow-up today for nausea. Was seen for this on 03/31/24 with labs overall reassuring.  Has history of pancreatitis with Trintellix in February 2025, which improved after stopping medication.  Recently psychiatry started her on Pristiq and Prazosin + increase her Propranolol  a few weeks back.  No pain with nausea.  She reports symptoms are similar to ones in February.  No alcohol use and no smoking.  Does vape, nicotine .  No drug use. She thought it was better until this Monday and then it returned.  She notices it more with an empty stomach, it will last a couple minutes and then vomits.  With her medications she does eat.  Has lost 5 lbs since April.  Has had nausea on and off for months now.  No use of alcohol in years. Does vape. Duration:days Frequency: intermittent Alleviating factors: Zofran  Aggravating factors: unknown Status: stable Treatments attempted: Zofran  and diet changes Fever: no Nausea: yes Vomiting: yes Weight loss: no Decreased appetite: no Diarrhea: no Constipation: no -- has BM every day with no straining Blood in stool: no Heartburn: on occasion, does not take anything for this Jaundice: no Rash: no Dysuria/urinary frequency: no Hematuria: no  Relevant past medical, surgical, family and social history reviewed and updated as indicated. Interim medical history since our last visit reviewed. Allergies and medications reviewed and updated.  Review of Systems  Constitutional:  Negative for activity change, appetite change, diaphoresis, fatigue and fever.  Respiratory:  Negative for cough, chest tightness,  shortness of breath and wheezing.   Cardiovascular:  Negative for chest pain, palpitations and leg swelling.  Gastrointestinal:  Positive for nausea and vomiting. Negative for abdominal distention, abdominal pain, blood in stool, constipation and diarrhea.  Neurological: Negative.   Psychiatric/Behavioral: Negative.      Per HPI unless specifically indicated above     Objective:    BP 125/78   Pulse 78   Temp 98.8 F (37.1 C) (Oral)   Ht 5' 7 (1.702 m)   Wt 174 lb 6.4 oz (79.1 kg)   SpO2 98%   BMI 27.31 kg/m   Wt Readings from Last 3 Encounters:  04/14/24 174 lb 6.4 oz (79.1 kg)  03/31/24 178 lb 12.8 oz (81.1 kg)  01/30/24 179 lb (81.2 kg)    Physical Exam Vitals and nursing note reviewed.  Constitutional:      General: She is awake. She is not in acute distress.    Appearance: She is well-developed and well-groomed. She is not ill-appearing or toxic-appearing.  HENT:     Head: Normocephalic.     Right Ear: Hearing and external ear normal.     Left Ear: Hearing and external ear normal.  Eyes:     General: Lids are normal.        Right eye: No discharge.        Left eye: No discharge.     Conjunctiva/sclera: Conjunctivae normal.     Pupils: Pupils are equal, round, and reactive to light.  Neck:  Thyroid: No thyromegaly.     Vascular: No carotid bruit.  Cardiovascular:     Rate and Rhythm: Normal rate and regular rhythm.     Heart sounds: Normal heart sounds. No murmur heard.    No gallop.  Pulmonary:     Effort: Pulmonary effort is normal. No accessory muscle usage or respiratory distress.     Breath sounds: Normal breath sounds.  Abdominal:     General: Bowel sounds are normal. There is no distension.     Palpations: Abdomen is soft.     Tenderness: There is no abdominal tenderness. There is no right CVA tenderness or left CVA tenderness.     Hernia: No hernia is present.  Musculoskeletal:     Cervical back: Normal range of motion and neck supple.      Right lower leg: No edema.     Left lower leg: No edema.  Lymphadenopathy:     Cervical: No cervical adenopathy.  Skin:    General: Skin is warm and dry.  Neurological:     Mental Status: She is alert and oriented to person, place, and time.     Deep Tendon Reflexes: Reflexes are normal and symmetric.     Reflex Scores:      Brachioradialis reflexes are 2+ on the right side and 2+ on the left side.      Patellar reflexes are 2+ on the right side and 2+ on the left side. Psychiatric:        Attention and Perception: Attention normal.        Mood and Affect: Mood normal.        Speech: Speech normal.        Behavior: Behavior normal. Behavior is cooperative.        Thought Content: Thought content normal.    Results for orders placed or performed in visit on 03/31/24  CBC with Differential/Platelet   Collection Time: 03/31/24  1:24 PM  Result Value Ref Range   WBC 10.5 3.4 - 10.8 x10E3/uL   RBC 4.05 3.77 - 5.28 x10E6/uL   Hemoglobin 12.1 11.1 - 15.9 g/dL   Hematocrit 61.1 65.9 - 46.6 %   MCV 96 79 - 97 fL   MCH 29.9 26.6 - 33.0 pg   MCHC 31.2 (L) 31.5 - 35.7 g/dL   RDW 86.8 88.2 - 84.5 %   Platelets 305 150 - 450 x10E3/uL   Neutrophils 58 Not Estab. %   Lymphs 33 Not Estab. %   Monocytes 4 Not Estab. %   Eos 4 Not Estab. %   Basos 1 Not Estab. %   Neutrophils Absolute 6.2 1.4 - 7.0 x10E3/uL   Lymphocytes Absolute 3.4 (H) 0.7 - 3.1 x10E3/uL   Monocytes Absolute 0.4 0.1 - 0.9 x10E3/uL   EOS (ABSOLUTE) 0.4 0.0 - 0.4 x10E3/uL   Basophils Absolute 0.1 0.0 - 0.2 x10E3/uL   Immature Granulocytes 0 Not Estab. %   Immature Grans (Abs) 0.0 0.0 - 0.1 x10E3/uL  Comprehensive metabolic panel with GFR   Collection Time: 03/31/24  1:24 PM  Result Value Ref Range   Glucose 89 70 - 99 mg/dL   BUN 7 6 - 20 mg/dL   Creatinine, Ser 9.24 0.57 - 1.00 mg/dL   eGFR 895 >40 fO/fpw/8.26   BUN/Creatinine Ratio 9 9 - 23   Sodium 138 134 - 144 mmol/L   Potassium 4.9 3.5 - 5.2 mmol/L    Chloride 102 96 - 106 mmol/L   CO2 21 20 -  29 mmol/L   Calcium  9.8 8.7 - 10.2 mg/dL   Total Protein 7.2 6.0 - 8.5 g/dL   Albumin 4.8 3.9 - 4.9 g/dL   Globulin, Total 2.4 1.5 - 4.5 g/dL   Bilirubin Total 0.2 0.0 - 1.2 mg/dL   Alkaline Phosphatase 58 44 - 121 IU/L   AST 16 0 - 40 IU/L   ALT 9 0 - 32 IU/L  TSH   Collection Time: 03/31/24  1:24 PM  Result Value Ref Range   TSH 1.320 0.450 - 4.500 uIU/mL  Amylase   Collection Time: 03/31/24  1:24 PM  Result Value Ref Range   Amylase 48 31 - 110 U/L  Lipase   Collection Time: 03/31/24  1:24 PM  Result Value Ref Range   Lipase 47 14 - 72 U/L  Gamma GT   Collection Time: 03/31/24  1:24 PM  Result Value Ref Range   GGT 14 0 - 60 IU/L      Assessment & Plan:   Problem List Items Addressed This Visit       Digestive   Nausea and vomiting - Primary   Acute and present for months on and off since acute pancreatitis in February 2025.  Recent lab results reassuring.  Notices more while empty stomach present.  Will obtain imaging, CT abdomen, to further assess. Trial Pepcid  20 MG daily to see if this assists with symptom improvement.  Referral to GI placed.  Discussed plan of care with patient.      Relevant Orders   CT ABDOMEN PELVIS WO CONTRAST   Ambulatory referral to Gastroenterology     Other   History of pancreatitis   Refer to N&V plan of care.      Relevant Orders   CT ABDOMEN PELVIS WO CONTRAST      Follow up plan: Return in about 6 weeks (around 05/26/2024) for CHRONIC NAUSEA.

## 2024-04-14 NOTE — Assessment & Plan Note (Signed)
 Refer to N&V plan of care.

## 2024-04-14 NOTE — Assessment & Plan Note (Signed)
 Acute and present for months on and off since acute pancreatitis in February 2025.  Recent lab results reassuring.  Notices more while empty stomach present.  Will obtain imaging, CT abdomen, to further assess. Trial Pepcid  20 MG daily to see if this assists with symptom improvement.  Referral to GI placed.  Discussed plan of care with patient.

## 2024-04-15 ENCOUNTER — Other Ambulatory Visit: Payer: Self-pay | Admitting: Physician Assistant

## 2024-04-15 DIAGNOSIS — N3941 Urge incontinence: Secondary | ICD-10-CM

## 2024-04-27 ENCOUNTER — Ambulatory Visit
Admission: RE | Admit: 2024-04-27 | Discharge: 2024-04-27 | Disposition: A | Discharge status: Home / Self Care | Source: Ambulatory Visit | Attending: Nurse Practitioner | Admitting: Nurse Practitioner

## 2024-04-27 DIAGNOSIS — R112 Nausea with vomiting, unspecified: Secondary | ICD-10-CM | POA: Insufficient documentation

## 2024-04-27 DIAGNOSIS — Z8719 Personal history of other diseases of the digestive system: Secondary | ICD-10-CM | POA: Insufficient documentation

## 2024-04-29 ENCOUNTER — Encounter: Payer: Self-pay | Admitting: Nurse Practitioner

## 2024-04-29 NOTE — Telephone Encounter (Signed)
 Sick appt

## 2024-05-03 ENCOUNTER — Ambulatory Visit: Payer: Self-pay | Admitting: Nurse Practitioner

## 2024-05-07 ENCOUNTER — Ambulatory Visit (INDEPENDENT_AMBULATORY_CARE_PROVIDER_SITE_OTHER): Admitting: Family Medicine

## 2024-05-07 ENCOUNTER — Encounter: Payer: Self-pay | Admitting: Family Medicine

## 2024-05-07 VITALS — BP 133/84 | HR 85 | Temp 98.4°F | Ht 67.0 in | Wt 171.4 lb

## 2024-05-07 DIAGNOSIS — R112 Nausea with vomiting, unspecified: Secondary | ICD-10-CM | POA: Diagnosis not present

## 2024-05-07 MED ORDER — OMEPRAZOLE 20 MG PO CPDR: 20.0000 mg | DELAYED_RELEASE_CAPSULE | Freq: Every day | ORAL | 3 refills | Status: DC

## 2024-05-07 NOTE — Assessment & Plan Note (Signed)
 Chronic. Mid work up. Not currently on PPI. Will start one for 2 weeks to see if it can help with gastric irritation. Follow up with PCP as scheduled in 3 weeks. Call with any concerns.

## 2024-05-07 NOTE — Progress Notes (Signed)
 BP 133/84   Pulse 85   Temp 98.4 F (36.9 C) (Oral)   Ht 5' 7 (1.702 m)   Wt 171 lb 6.4 oz (77.7 kg)   SpO2 99%   BMI 26.85 kg/m    Subjective:    Patient ID: Gail Mullins, female    DOB: 15-Aug-1986, 38 y.o.   MRN: [id number]  HPI: Gail Mullins is a 38 y.o. female  Chief Complaint  Patient presents with   Nausea    Nausea and vomiting last few months. Patient states she got a CT last week.   Emesis   VOMITING Duration: chronic, worse last week, then better Diarrhea: no   Episodes of diarrhea/day:  Nausea: yes Vomiting: yes Episodes of vomit/day: 1x a day the past couple of days- had been 3x a day last week Abdominal pain: no Fever: no Decreased appetite: no Tolerating liquids: yes Foreign travel: no Relevant dietary history: N/A Similar illness in contacts: no Recent antibiotic use: no Status: better Treatments attempted: zofran   Relevant past medical, surgical, family and social history reviewed and updated as indicated. Interim medical history since our last visit reviewed. Allergies and medications reviewed and updated.  Review of Systems  Constitutional: Negative.   Respiratory: Negative.    Cardiovascular: Negative.   Gastrointestinal:  Positive for nausea and vomiting. Negative for abdominal distention, abdominal pain, anal bleeding, blood in stool, constipation, diarrhea and rectal pain.  Musculoskeletal: Negative.   Psychiatric/Behavioral: Negative.      Per HPI unless specifically indicated above     Objective:    BP 133/84   Pulse 85   Temp 98.4 F (36.9 C) (Oral)   Ht 5' 7 (1.702 m)   Wt 171 lb 6.4 oz (77.7 kg)   SpO2 99%   BMI 26.85 kg/m   Wt Readings from Last 3 Encounters:  05/07/24 171 lb 6.4 oz (77.7 kg)  04/14/24 174 lb 6.4 oz (79.1 kg)  03/31/24 178 lb 12.8 oz (81.1 kg)    Physical Exam Vitals and nursing note reviewed.  Constitutional:      General: She is not in acute distress.    Appearance: Normal  appearance. She is not ill-appearing, toxic-appearing or diaphoretic.  HENT:     Head: Normocephalic and atraumatic.     Right Ear: External ear normal.     Left Ear: External ear normal.     Nose: Nose normal.     Mouth/Throat:     Mouth: Mucous membranes are moist.     Pharynx: Oropharynx is clear.  Eyes:     General: No scleral icterus.       Right eye: No discharge.        Left eye: No discharge.     Extraocular Movements: Extraocular movements intact.     Conjunctiva/sclera: Conjunctivae normal.     Pupils: Pupils are equal, round, and reactive to light.  Cardiovascular:     Rate and Rhythm: Normal rate and regular rhythm.     Pulses: Normal pulses.     Heart sounds: Normal heart sounds. No murmur heard.    No friction rub. No gallop.  Pulmonary:     Effort: Pulmonary effort is normal. No respiratory distress.     Breath sounds: Normal breath sounds. No stridor. No wheezing, rhonchi or rales.  Chest:     Chest wall: No tenderness.  Abdominal:     General: Abdomen is flat. Bowel sounds are normal. There is no distension.     Palpations: Abdomen  is soft. There is no mass.     Tenderness: There is no abdominal tenderness. There is no right CVA tenderness, left CVA tenderness, guarding or rebound.     Hernia: No hernia is present.  Musculoskeletal:        General: Normal range of motion.     Cervical back: Normal range of motion and neck supple.  Skin:    General: Skin is warm and dry.     Capillary Refill: Capillary refill takes less than 2 seconds.     Coloration: Skin is not jaundiced or pale.     Findings: No bruising, erythema, lesion or rash.  Neurological:     General: No focal deficit present.     Mental Status: She is alert and oriented to person, place, and time. Mental status is at baseline.  Psychiatric:        Mood and Affect: Mood normal.        Behavior: Behavior normal.        Thought Content: Thought content normal.        Judgment: Judgment normal.      Results for orders placed or performed in visit on 03/31/24  CBC with Differential/Platelet   Collection Time: 03/31/24  1:24 PM  Result Value Ref Range   WBC 10.5 3.4 - 10.8 x10E3/uL   RBC 4.05 3.77 - 5.28 x10E6/uL   Hemoglobin 12.1 11.1 - 15.9 g/dL   Hematocrit 61.1 65.9 - 46.6 %   MCV 96 79 - 97 fL   MCH 29.9 26.6 - 33.0 pg   MCHC 31.2 (L) 31.5 - 35.7 g/dL   RDW 86.8 88.2 - 84.5 %   Platelets 305 150 - 450 x10E3/uL   Neutrophils 58 Not Estab. %   Lymphs 33 Not Estab. %   Monocytes 4 Not Estab. %   Eos 4 Not Estab. %   Basos 1 Not Estab. %   Neutrophils Absolute 6.2 1.4 - 7.0 x10E3/uL   Lymphocytes Absolute 3.4 (H) 0.7 - 3.1 x10E3/uL   Monocytes Absolute 0.4 0.1 - 0.9 x10E3/uL   EOS (ABSOLUTE) 0.4 0.0 - 0.4 x10E3/uL   Basophils Absolute 0.1 0.0 - 0.2 x10E3/uL   Immature Granulocytes 0 Not Estab. %   Immature Grans (Abs) 0.0 0.0 - 0.1 x10E3/uL  Comprehensive metabolic panel with GFR   Collection Time: 03/31/24  1:24 PM  Result Value Ref Range   Glucose 89 70 - 99 mg/dL   BUN 7 6 - 20 mg/dL   Creatinine, Ser 9.24 0.57 - 1.00 mg/dL   eGFR 895 >40 fO/fpw/8.26   BUN/Creatinine Ratio 9 9 - 23   Sodium 138 134 - 144 mmol/L   Potassium 4.9 3.5 - 5.2 mmol/L   Chloride 102 96 - 106 mmol/L   CO2 21 20 - 29 mmol/L   Calcium  9.8 8.7 - 10.2 mg/dL   Total Protein 7.2 6.0 - 8.5 g/dL   Albumin 4.8 3.9 - 4.9 g/dL   Globulin, Total 2.4 1.5 - 4.5 g/dL   Bilirubin Total 0.2 0.0 - 1.2 mg/dL   Alkaline Phosphatase 58 44 - 121 IU/L   AST 16 0 - 40 IU/L   ALT 9 0 - 32 IU/L  TSH   Collection Time: 03/31/24  1:24 PM  Result Value Ref Range   TSH 1.320 0.450 - 4.500 uIU/mL  Amylase   Collection Time: 03/31/24  1:24 PM  Result Value Ref Range   Amylase 48 31 - 110 U/L  Lipase  Collection Time: 03/31/24  1:24 PM  Result Value Ref Range   Lipase 47 14 - 72 U/L  Gamma GT   Collection Time: 03/31/24  1:24 PM  Result Value Ref Range   GGT 14 0 - 60 IU/L      Assessment & Plan:    Problem List Items Addressed This Visit       Digestive   Nausea and vomiting - Primary   Chronic. Mid work up. Not currently on PPI. Will start one for 2 weeks to see if it can help with gastric irritation. Follow up with PCP as scheduled in 3 weeks. Call with any concerns.         Follow up plan: Return for As scheduled.

## 2024-05-28 ENCOUNTER — Telehealth: Payer: Self-pay

## 2024-05-28 ENCOUNTER — Other Ambulatory Visit (HOSPITAL_COMMUNITY): Payer: Self-pay

## 2024-05-28 NOTE — Telephone Encounter (Signed)
 Pharmacy Patient Advocate Encounter   Received notification from Onbase that prior authorization for Nurtec 75MG  dispersible tablets is required/requested.   Insurance verification completed.   The patient is insured through [facility] MEDICAID .   Per test claim: PA required; PA submitted to above mentioned insurance via Latent Key/confirmation #/EOC [id number] Status is pending

## 2024-05-29 NOTE — Patient Instructions (Signed)
 Vomiting, Adult Vomiting is when stomach contents forcefully come out of the mouth. Many people notice nausea before vomiting. Vomiting can make you feel weak and cause you to become dehydrated. Dehydration can make you feel tired and thirsty, cause you to have a dry mouth, and decrease how often you urinate. Older adults and people who have other diseases or a weak body defense system (immune system) are at higher risk for dehydration. It is important to treat vomiting as told by your health care provider. Follow these instructions at home:  Watch your symptoms for any changes. Tell your health care provider about them. Eating and drinking     Follow these recommendations as told by your health care provider: Take an oral rehydration solution (ORS). This is a drink that is sold at pharmacies and retail stores. Eat bland, easy-to-digest foods in small amounts as you are able. These foods include bananas, applesauce, rice, lean meats, toast, and crackers. Drink clear fluids slowly and in small amounts as you are able. Clear fluids include water, ice chips, low-calorie sports drinks, and fruit juice that has water added (diluted fruit juice). Avoid drinking fluids that contain a lot of sugar or caffeine, such as energy drinks, sports drinks, and soda. Avoid alcohol. Avoid spicy or fatty foods.  General instructions Wash your hands often using soap and water for at least 20 seconds. If soap and water are not available, use hand sanitizer. Make sure that everyone in your household washes their hands frequently. Take over-the-counter and prescription medicines only as told by your health care provider. Rest at home while you recover. Watch your condition for any changes. Keep all follow-up visits. This is important. Contact a health care provider if: Your vomiting gets worse. You have new symptoms. You have a fever. You cannot drink fluids without vomiting. You feel light-headed or  dizzy. You have a headache. You have muscle cramps. You have a rash. You have pain while urinating. Get help right away if: You have pain in your chest, neck, arm, or jaw. Your heart is beating very quickly. You have trouble breathing or you are breathing very quickly. You feel extremely weak or you faint. Your skin feels cold and clammy. You feel confused. You have persistent vomiting. You have vomit that is bright red or looks like black coffee grounds. You have stools (feces) that are bloody or black, or stools that look like tar. You have a severe headache, a stiff neck, or both. You have severe pain, cramping, or bloating in your abdomen. You have signs of dehydration, such as: Dark urine, very little urine, or no urine. Cracked lips. Dry mouth. Sunken eyes. Sleepiness. Weakness. These symptoms may be an emergency. Get help right away. Call 911. Do not wait to see if the symptoms will go away. Do not drive yourself to the hospital. Summary Vomiting is when stomach contents forcefully come out of the mouth. Vomiting can cause you to become dehydrated. It is important to treat vomiting as told by your health care provider. Follow your health care provider's instructions about eating and drinking. Wash your hands often using soap and water for at least 20 seconds. If soap and water are not available, use hand sanitizer. Watch your condition for any changes and for signs of dehydration. Keep all follow-up visits. This is important. This information is not intended to replace advice given to you by your health care provider. Make sure you discuss any questions you have with your health care provider.  Document Revised: 03/30/2021 Document Reviewed: 03/30/2021 Elsevier Patient Education  2024 [employer].

## 2024-06-01 ENCOUNTER — Encounter: Payer: Self-pay | Admitting: Nurse Practitioner

## 2024-06-01 ENCOUNTER — Ambulatory Visit (INDEPENDENT_AMBULATORY_CARE_PROVIDER_SITE_OTHER): Admitting: Nurse Practitioner

## 2024-06-01 VITALS — BP 99/64 | HR 80 | Temp 98.7°F | Ht 67.0 in | Wt 167.8 lb

## 2024-06-01 DIAGNOSIS — R112 Nausea with vomiting, unspecified: Secondary | ICD-10-CM

## 2024-06-01 DIAGNOSIS — N3941 Urge incontinence: Secondary | ICD-10-CM | POA: Insufficient documentation

## 2024-06-01 MED ORDER — OMEPRAZOLE 20 MG PO CPDR: 20.0000 mg | DELAYED_RELEASE_CAPSULE | Freq: Every day | ORAL | 3 refills | Status: AC

## 2024-06-01 NOTE — Assessment & Plan Note (Signed)
 Ongoing.  Continue to collaborate with urology and will place referral to GYN per recommendations.  Patient reports this had ben recommended.

## 2024-06-01 NOTE — Assessment & Plan Note (Signed)
 Improved at this time with Omeprazole , will continue this at least until she sees GI for further work-up.  Refills sent in.  Educated her on this.

## 2024-06-01 NOTE — Progress Notes (Signed)
 BP 99/64   Pulse 80   Temp 98.7 F (37.1 C) (Oral)   Ht 5' 7 (1.702 m)   Wt 167 lb 12.8 oz (76.1 kg)   LMP  (LMP Unknown)   SpO2 97%   BMI 26.28 kg/m    Subjective:    Patient ID: [name], female    DOB: 09-04-1986, 38 y.o.   MRN: [id number]  HPI: Allean Montfort is a 38 y.o. female  Chief Complaint  Patient presents with   Nausea    6 week f/up   Would like referral to GYN for ongoing issues with OAB, urology had recommended she see them. Vesicare  is no longer offering her benefit.  NAUSEA Follow-up today for nausea. Was seen last on 05/07/24 when PPI was started. She reports the Omeprazole  has helped her nausea.  Has had on incident the other day, but no other episodes.  Scheduled to see GI on 09/15/24. Continues to have a reduced appetite, her appetite has always been a little off. Weight has varied in past between 160 to 180 range. Duration:months Frequency: as above Alleviating factors: Omeprazole  Aggravating factors: unknown Status: better Treatments attempted: PPI Fever: no Nausea: improved Vomiting: improved Weight loss: yes Decreased appetite: yes at baseline Diarrhea: no Constipation: no Blood in stool: no Heartburn: no Jaundice: no Rash: no Recurrent NSAID use: sometimes takes but not often   Relevant past medical, surgical, family and social history reviewed and updated as indicated. Interim medical history since our last visit reviewed. Allergies and medications reviewed and updated.  Review of Systems  Constitutional:  Negative for activity change, appetite change, diaphoresis, fatigue and fever.  Respiratory:  Negative for cough, chest tightness, shortness of breath and wheezing.   Cardiovascular:  Negative for chest pain, palpitations and leg swelling.  Gastrointestinal:  Positive for nausea and vomiting. Negative for abdominal distention, abdominal pain, blood in stool, constipation and diarrhea.  Neurological: Negative.    Psychiatric/Behavioral: Negative.      Per HPI unless specifically indicated above     Objective:    BP 99/64   Pulse 80   Temp 98.7 F (37.1 C) (Oral)   Ht 5' 7 (1.702 m)   Wt 167 lb 12.8 oz (76.1 kg)   LMP  (LMP Unknown)   SpO2 97%   BMI 26.28 kg/m   Wt Readings from Last 3 Encounters:  06/01/24 167 lb 12.8 oz (76.1 kg)  05/07/24 171 lb 6.4 oz (77.7 kg)  04/14/24 174 lb 6.4 oz (79.1 kg)    Physical Exam Vitals and nursing note reviewed.  Constitutional:      General: She is awake. She is not in acute distress.    Appearance: She is well-developed and well-groomed. She is not ill-appearing or toxic-appearing.  HENT:     Head: Normocephalic.     Right Ear: Hearing and external ear normal.     Left Ear: Hearing and external ear normal.  Eyes:     General: Lids are normal.        Right eye: No discharge.        Left eye: No discharge.     Conjunctiva/sclera: Conjunctivae normal.     Pupils: Pupils are equal, round, and reactive to light.  Neck:     Thyroid: No thyromegaly.     Vascular: No carotid bruit.  Cardiovascular:     Rate and Rhythm: Normal rate and regular rhythm.     Heart sounds: Normal heart sounds. No murmur heard.  No gallop.  Pulmonary:     Effort: Pulmonary effort is normal. No accessory muscle usage or respiratory distress.     Breath sounds: Normal breath sounds.  Abdominal:     General: Bowel sounds are normal. There is no distension.     Palpations: Abdomen is soft.     Tenderness: There is no abdominal tenderness. There is no right CVA tenderness or left CVA tenderness.     Hernia: No hernia is present.  Musculoskeletal:     Cervical back: Normal range of motion and neck supple.     Right lower leg: No edema.     Left lower leg: No edema.  Lymphadenopathy:     Cervical: No cervical adenopathy.  Skin:    General: Skin is warm and dry.  Neurological:     Mental Status: She is alert and oriented to person, place, and time.     Deep  Tendon Reflexes: Reflexes are normal and symmetric.     Reflex Scores:      Brachioradialis reflexes are 2+ on the right side and 2+ on the left side.      Patellar reflexes are 2+ on the right side and 2+ on the left side. Psychiatric:        Attention and Perception: Attention normal.        Mood and Affect: Mood normal.        Speech: Speech normal.        Behavior: Behavior normal. Behavior is cooperative.        Thought Content: Thought content normal.     Results for orders placed or performed in visit on 03/31/24  CBC with Differential/Platelet   Collection Time: 03/31/24  1:24 PM  Result Value Ref Range   WBC 10.5 3.4 - 10.8 x10E3/uL   RBC 4.05 3.77 - 5.28 x10E6/uL   Hemoglobin 12.1 11.1 - 15.9 g/dL   Hematocrit 61.1 65.9 - 46.6 %   MCV 96 79 - 97 fL   MCH 29.9 26.6 - 33.0 pg   MCHC 31.2 (L) 31.5 - 35.7 g/dL   RDW 86.8 88.2 - 84.5 %   Platelets 305 150 - 450 x10E3/uL   Neutrophils 58 Not Estab. %   Lymphs 33 Not Estab. %   Monocytes 4 Not Estab. %   Eos 4 Not Estab. %   Basos 1 Not Estab. %   Neutrophils Absolute 6.2 1.4 - 7.0 x10E3/uL   Lymphocytes Absolute 3.4 (H) 0.7 - 3.1 x10E3/uL   Monocytes Absolute 0.4 0.1 - 0.9 x10E3/uL   EOS (ABSOLUTE) 0.4 0.0 - 0.4 x10E3/uL   Basophils Absolute 0.1 0.0 - 0.2 x10E3/uL   Immature Granulocytes 0 Not Estab. %   Immature Grans (Abs) 0.0 0.0 - 0.1 x10E3/uL  Comprehensive metabolic panel with GFR   Collection Time: 03/31/24  1:24 PM  Result Value Ref Range   Glucose 89 70 - 99 mg/dL   BUN 7 6 - 20 mg/dL   Creatinine, Ser 9.24 0.57 - 1.00 mg/dL   eGFR 895 >40 fO/fpw/8.26   BUN/Creatinine Ratio 9 9 - 23   Sodium 138 134 - 144 mmol/L   Potassium 4.9 3.5 - 5.2 mmol/L   Chloride 102 96 - 106 mmol/L   CO2 21 20 - 29 mmol/L   Calcium  9.8 8.7 - 10.2 mg/dL   Total Protein 7.2 6.0 - 8.5 g/dL   Albumin 4.8 3.9 - 4.9 g/dL   Globulin, Total 2.4 1.5 - 4.5 g/dL   Bilirubin  Total 0.2 0.0 - 1.2 mg/dL   Alkaline Phosphatase 58 44 - 121  IU/L   AST 16 0 - 40 IU/L   ALT 9 0 - 32 IU/L  TSH   Collection Time: 03/31/24  1:24 PM  Result Value Ref Range   TSH 1.320 0.450 - 4.500 uIU/mL  Amylase   Collection Time: 03/31/24  1:24 PM  Result Value Ref Range   Amylase 48 31 - 110 U/L  Lipase   Collection Time: 03/31/24  1:24 PM  Result Value Ref Range   Lipase 47 14 - 72 U/L  Gamma GT   Collection Time: 03/31/24  1:24 PM  Result Value Ref Range   GGT 14 0 - 60 IU/L      Assessment & Plan:   Problem List Items Addressed This Visit       Digestive   Nausea and vomiting   Improved at this time with Omeprazole , will continue this at least until she sees GI for further work-up.  Refills sent in.  Educated her on this.        Other   Urgency incontinence - Primary   Ongoing.  Continue to collaborate with urology and will place referral to GYN per recommendations.  Patient reports this had ben recommended.        Relevant Orders   Ambulatory referral to Gynecology     Follow up plan: Return for as scheduled in October.

## 2024-06-01 NOTE — Telephone Encounter (Signed)
 Pharmacy Patient Advocate Encounter  Received notification from [employer] that Prior Authorization for Nurtec 75MG  dispersible tablets has been DENIED.  Full denial letter will be uploaded to the media tab. See denial reason below.   PA #/Case ID/Reference #: [id number]

## 2024-06-08 ENCOUNTER — Encounter: Payer: Self-pay | Admitting: Nurse Practitioner

## 2024-06-11 NOTE — Telephone Encounter (Signed)
 Please see note from 08/22, PA has been denied.

## 2024-06-15 ENCOUNTER — Encounter: Payer: Self-pay | Admitting: Nurse Practitioner

## 2024-06-15 DIAGNOSIS — F4310 Post-traumatic stress disorder, unspecified: Secondary | ICD-10-CM

## 2024-06-15 DIAGNOSIS — F321 Major depressive disorder, single episode, moderate: Secondary | ICD-10-CM

## 2024-06-15 NOTE — Addendum Note (Signed)
 Addended by: Rudy Domek T on: 06/15/2024 05:33 PM   Modules accepted: Orders

## 2024-06-21 ENCOUNTER — Ambulatory Visit (INDEPENDENT_AMBULATORY_CARE_PROVIDER_SITE_OTHER): Admitting: Urology

## 2024-06-21 DIAGNOSIS — N3941 Urge incontinence: Secondary | ICD-10-CM | POA: Diagnosis not present

## 2024-06-21 LAB — URINALYSIS, COMPLETE
Bilirubin, UA: NEGATIVE
Glucose, UA: NEGATIVE
Ketones, UA: NEGATIVE
Leukocytes,UA: NEGATIVE
Nitrite, UA: NEGATIVE
Protein,UA: NEGATIVE
Specific Gravity, UA: <1.005 — ABNORMAL LOW (ref 1.005–1.030)
Urobilinogen, Ur: 0.2 mg/dL (ref 0.2–1.0)
pH, UA: 6 (ref 5.0–7.5)

## 2024-06-21 LAB — MICROSCOPIC EXAMINATION
Bacteria, UA: NONE SEEN
Epithelial Cells (non renal): >10 /HPF — AB (ref 0–10)

## 2024-06-21 MED ORDER — MIRABEGRON ER 50 MG PO TB24: 50.0000 mg | ORAL_TABLET | Freq: Every day | ORAL | 11 refills | Status: DC

## 2024-06-21 NOTE — Addendum Note (Signed)
 Addended byBETHA CORIE PLATER on: 06/21/2024 09:41 AM   Modules accepted: Orders

## 2024-06-21 NOTE — Progress Notes (Signed)
 06/21/2024 9:28 AM   [name] 19-Mar-1986 [id number]  Referring provider: Valerio Melanie DASEN, NP [address] [place],  [place] [address]  No chief complaint on file.   HPI: I was consulted to assess the patient's urgency and frequency.  Her last 3 years she can have sudden urgency especially when she goes from sitting to standing position.  She can leak a small amount but does not wear a pad and is almost daily.  Caffeine reduction helps some.  No stress incontinence or bedwetting   She can void twice in an hour and cannot sit for 2 hours watching movie without needing to urinate.  She gets up once at night.   Flow was poor and she feels empty     Patient has mild to moderate overactive bladder with frequency and urgency affecting her quality of life.  Role of medical and behavioral therapy discussed.  Call if urine culture positive.  Reassess in 6 weeks with pelvic examination and cystoscopy on Gemtesa  samples and prescription.   Today Frequency stable.  Last culture negative Urgency and frequency dramatically better.  Has only leaked twice and is very pleased. On pelvic examination she had grade 2 hypermobility the bladder neck and negative cough test after cystoscopy and no prolapse Cystoscopy: Patient underwent flexible cystoscopy.  Bladder mucosa and trigone were normal.  No cystitis.  No carcinoma.  Well-tolerated  Prescription renewed. Reassess durability in 4 months and then annually    Today Insurance would not cover the Gemtesa .  She was doing great.  Now she has urge incontinence and frequency.  Clinically not infected.  See in 6 weeks on oxybutynin .  Today I last saw the patient March 2025.  She saw the extender in April 2025 and she failed oxybutynin .  She was placed on Vesicare .  Urine culture normal from that visit Vesicare  worked for a few weeks but she stopped it due to lack of efficacy clinically not infected   PMH: Past Medical History:  Diagnosis Date    Alcohol abuse    Anemia July 2024   N/A   Anxiety    Depression    Substance abuse (HCC) 2003   N/A    Surgical History: Past Surgical History:  Procedure Laterality Date   ESOPHAGOGASTRODUODENOSCOPY N/A 08/15/2019   Procedure: ESOPHAGOGASTRODUODENOSCOPY (EGD);  Surgeon: Toledo, Ladell POUR, MD;  Location: [facility] ENDOSCOPY;  Service: Gastroenterology;  Laterality: N/A;    Home Medications:  Allergies as of 06/21/2024       Reactions   Trintellix [vortioxetine] Other (See Comments)   Pancreatitis        Medication List        Accurate as of June 21, 2024  9:28 AM. If you have any questions, ask your nurse or doctor.          desvenlafaxine 100 MG 24 hr tablet Commonly known as: PRISTIQ Take 100 mg by mouth daily.   famotidine  20 MG tablet Commonly known as: Pepcid  Take 1 tablet (20 mg total) by mouth daily.   Nurtec 75 MG Tbdp Generic drug: Rimegepant Sulfate Take 1 tablet by mouth every other day for 30 days.   omeprazole  20 MG capsule Commonly known as: PRILOSEC Take 1 capsule (20 mg total) by mouth daily.   ondansetron  4 MG disintegrating tablet Commonly known as: ZOFRAN -ODT Take 1 tablet (4 mg total) by mouth every 8 (eight) hours as needed for nausea or vomiting.   OVER THE COUNTER MEDICATION Take 1 tablet by  mouth daily. Blood Builder   prazosin 1 MG capsule Commonly known as: MINIPRESS Take 1 mg by mouth at bedtime.   propranolol  20 MG tablet Commonly known as: INDERAL    solifenacin  10 MG tablet Commonly known as: VESICARE  TAKE 1 TABLET BY MOUTH ONCE DAILY   SUMAtriptan  50 MG tablet Commonly known as: IMITREX  TAKE 1 TABLET BY MOUTH EVERY 2 HOURS AS NEEDED MIGRAINE MAY REPEAT IN 2 HOURSIF NEEDED   Vraylar 4.5 MG Caps Generic drug: Cariprazine HCl Take 4.5 capsules by mouth daily.        Allergies:  Allergies  Allergen Reactions   Trintellix [Vortioxetine] Other (See Comments)    Pancreatitis    Family History: Family  History  Problem Relation Age of Onset   Breast cancer Neg Hx     Social History:  reports that she has quit smoking. Her smoking use included cigarettes. She has a 7.5 pack-year smoking history. She has been exposed to tobacco smoke. She has never used smokeless tobacco. She reports that she does not currently use alcohol. She reports that she does not currently use drugs.  ROS:                                        Physical Exam: LMP  (LMP Unknown)   Constitutional:  Alert and oriented, No acute distress.  Laboratory Data: Lab Results  Component Value Date   WBC 10.5 03/31/2024   HGB 12.1 03/31/2024   HCT 38.8 03/31/2024   MCV 96 03/31/2024   PLT 305 03/31/2024    Lab Results  Component Value Date   CREATININE 0.75 03/31/2024    No results found for: PSA  No results found for: TESTOSTERONE  No results found for: HGBA1C  Urinalysis    Component Value Date/Time   COLORURINE YELLOW (A) 04/17/2021 2208   APPEARANCEUR Hazy (A) 01/16/2024 0945   LABSPEC 1.018 04/17/2021 2208   PHURINE 5.0 04/17/2021 2208   GLUCOSEU Negative 01/16/2024 0945   HGBUR MODERATE (A) 04/17/2021 2208   BILIRUBINUR Negative 01/16/2024 0945   KETONESUR 5 (A) 04/17/2021 2208   PROTEINUR Negative 01/16/2024 0945   PROTEINUR NEGATIVE 04/17/2021 2208   NITRITE Negative 01/16/2024 0945   NITRITE NEGATIVE 04/17/2021 2208   LEUKOCYTESUR Trace (A) 01/16/2024 0945   LEUKOCYTESUR TRACE (A) 04/17/2021 2208    Pertinent Imaging:   Assessment & Plan: Reassess in 6 to 8 weeks on Myrbetriq  50 mg 30 x 11.  Discussed refractory treatments then.  Simple urodynamics and option call if urine culture positive  1. Urgency incontinence (Primary)    No follow-ups on file.  Glendia DELENA Elizabeth, MD  [facility] [address] [place], [place] [address] [phone]

## 2024-06-22 ENCOUNTER — Encounter: Payer: Self-pay | Admitting: Urology

## 2024-06-22 DIAGNOSIS — N3941 Urge incontinence: Secondary | ICD-10-CM

## 2024-06-24 MED ORDER — TOLTERODINE TARTRATE ER 4 MG PO CP24: 4.0000 mg | ORAL_CAPSULE | Freq: Every day | ORAL | 11 refills | Status: AC

## 2024-06-25 LAB — CULTURE, URINE COMPREHENSIVE

## 2024-07-28 ENCOUNTER — Encounter: Payer: Self-pay | Admitting: Urology

## 2024-08-01 LAB — CYTOLOGY - PAP: HPV Aptima: POSITIVE

## 2024-08-01 NOTE — Patient Instructions (Signed)
 Be Involved in Caring For Your Health:  Taking Medications When medications are taken as directed, they can greatly improve your health. But if they are not taken as prescribed, they may not work. In some cases, not taking them correctly can be harmful. To help ensure your treatment remains effective and safe, understand your medications and how to take them. Bring your medications to each visit for review by your provider.  Your lab results, notes, and after visit summary will be available on My Chart. We strongly encourage you to use this feature. If lab results are abnormal the clinic will contact you with the appropriate steps. If the clinic does not contact you assume the results are satisfactory. You can always view your results on My Chart. If you have questions regarding your health or results, please contact the clinic during office hours. You can also ask questions on My Chart.  We at [facility] are grateful that you chose us  to provide your care. We strive to provide evidence-based and compassionate care and are always looking for feedback. If you get a survey from the clinic please complete this so we can hear your opinions.  Managing Depression, Adult Depression is a mental health condition that affects your thoughts, feelings, and actions. Being diagnosed with depression can bring you relief if you did not know why you have felt or behaved a certain way. It could also leave you feeling overwhelmed. Finding ways to manage your symptoms can help you feel more positive about your future. How to manage lifestyle changes Being depressed is difficult. Depression can increase the level of everyday stress. Stress can make depression symptoms worse. You may believe your symptoms cannot be managed or will never improve. However, there are many things you can try to help manage your symptoms. There is hope. Managing stress  Stress is your body's reaction to life changes and events,  both good and bad. Stress can add to your feelings of depression. Learning to manage your stress can help lessen your feelings of depression. Try some of the following approaches to reducing your stress (stress reduction techniques): Listen to music that you enjoy and that inspires you. Try using a meditation app or take a meditation class. Develop a practice that helps you connect with your spiritual self. Walk in nature, pray, or go to a place of worship. Practice deep breathing. To do this, inhale slowly through your nose. Pause at the top of your inhale for a few seconds and then exhale slowly, letting yourself relax. Repeat this three or four times. Practice yoga to help relax and work your muscles. Choose a stress reduction technique that works for you. These techniques take time and practice to develop. Set aside 5-15 minutes a day to do them. Therapists can offer training in these techniques. Do these things to help manage stress: Keep a journal. Know your limits. Set healthy boundaries for yourself and others, such as saying no when you think something is too much. Pay attention to how you react to certain situations. You may not be able to control everything, but you can change your reaction. Add humor to your life by watching funny movies or shows. Make time for activities that you enjoy and that relax you. Spend less time using electronics, especially at night before bed. The light from screens can make your brain think it is time to get up rather than go to bed.  Medicines Medicines, such as antidepressants, are often a part of  treatment for depression. Talk with your pharmacist or health care provider about all the medicines, supplements, and herbal products that you take, their possible side effects, and what medicines and other products are safe to take together. Make sure to report any side effects you may have to your health care provider. Relationships Your health care  provider may suggest family therapy, couples therapy, or individual therapy as part of your treatment. How to recognize changes Everyone responds differently to treatment for depression. As you recover from depression, you may start to: Have more interest in doing activities. Feel more hopeful. Have more energy. Eat a more regular amount of food. Have better mental focus. It is important to recognize if your depression is not getting better or is getting worse. The symptoms you had in the beginning may return, such as: Feeling tired. Eating too much or too little. Sleeping too much or too little. Feeling restless, agitated, or hopeless. Trouble focusing or making decisions. Having unexplained aches and pains. Feeling irritable, angry, or aggressive. If you or your family members notice these symptoms coming back, let your health care provider know right away. Follow these instructions at home: Activity Try to get some form of exercise each day, such as walking. Try yoga, mindfulness, or other stress reduction techniques. Participate in group activities if you are able. Lifestyle Get enough sleep. Cut down on or stop using caffeine, tobacco, alcohol, and any other harmful substances. Eat a healthy diet that includes plenty of vegetables, fruits, whole grains, low-fat dairy products, and lean protein. Limit foods that are high in solid fats, added sugar, or salt (sodium). General instructions Take over-the-counter and prescription medicines only as told by your health care provider. Keep all follow-up visits. It is important for your health care provider to check on your mood, behavior, and medicines. Your health care provider may need to make changes to your treatment. Where to find support Talking to others  Friends and family members can be sources of support and guidance. Talk to trusted friends or family members about your condition. Explain your symptoms and let them know that you  are working with a health care provider to treat your depression. Tell friends and family how they can help. Finances Find mental health providers that fit with your financial situation. Talk with your health care provider if you are worried about access to food, housing, or medicine. Call your insurance company to learn about your co-pays and prescription plan. Where to find more information You can find support in your area from: Anxiety and Depression Association of [place] (ADAA): adaa.org Mental Health [place]: mentalhealthamerica.net [employer] on Mental Illness: nami.org Contact a health care provider if: You stop taking your antidepressant medicines, and you have any of these symptoms: Nausea. Headache. Light-headedness. Chills and body aches. Not being able to sleep (insomnia). You or your friends and family think your depression is getting worse. Get help right away if: You have thoughts of hurting yourself or others. Get help right away if you feel like you may hurt yourself or others, or have thoughts about taking your own life. Go to your nearest emergency room or: Call 911. Call the National Suicide Prevention Lifeline at (743) 630-7432 or 988. This is open 24 hours a day. Text the Crisis Text Line at 854-590-3828. This information is not intended to replace advice given to you by your health care provider. Make sure you discuss any questions you have with your health care provider. Document Revised: 01/29/2022 Document Reviewed:  01/29/2022 Elsevier Patient Education  2024 [employer].

## 2024-08-02 ENCOUNTER — Encounter: Payer: Self-pay | Admitting: Nurse Practitioner

## 2024-08-02 ENCOUNTER — Ambulatory Visit: Admitting: Nurse Practitioner

## 2024-08-02 VITALS — BP 99/65 | HR 71 | Temp 98.3°F | Ht 67.0 in | Wt 164.0 lb

## 2024-08-02 DIAGNOSIS — K219 Gastro-esophageal reflux disease without esophagitis: Secondary | ICD-10-CM

## 2024-08-02 DIAGNOSIS — F4310 Post-traumatic stress disorder, unspecified: Secondary | ICD-10-CM | POA: Diagnosis not present

## 2024-08-02 DIAGNOSIS — F321 Major depressive disorder, single episode, moderate: Secondary | ICD-10-CM | POA: Diagnosis not present

## 2024-08-02 DIAGNOSIS — Z720 Tobacco use: Secondary | ICD-10-CM

## 2024-08-02 DIAGNOSIS — N3941 Urge incontinence: Secondary | ICD-10-CM

## 2024-08-02 DIAGNOSIS — E7800 Pure hypercholesterolemia, unspecified: Secondary | ICD-10-CM

## 2024-08-02 DIAGNOSIS — G43E09 Chronic migraine with aura, not intractable, without status migrainosus: Secondary | ICD-10-CM | POA: Diagnosis not present

## 2024-08-02 DIAGNOSIS — E559 Vitamin D deficiency, unspecified: Secondary | ICD-10-CM

## 2024-08-02 DIAGNOSIS — Z0000 Encounter for general adult medical examination without abnormal findings: Secondary | ICD-10-CM | POA: Diagnosis not present

## 2024-08-02 DIAGNOSIS — E611 Iron deficiency: Secondary | ICD-10-CM

## 2024-08-02 MED ORDER — BUTALBITAL-APAP-CAFFEINE 50-325-40 MG PO TABS: 1.0000 | ORAL_TABLET | Freq: Four times a day (QID) | ORAL | 2 refills | Status: AC | PRN

## 2024-08-02 NOTE — Progress Notes (Signed)
 BP 99/65   Pulse 71   Temp 98.3 F (36.8 C) (Oral)   Ht 5' 7 (1.702 m)   Wt 164 lb (74.4 kg)   LMP  (LMP Unknown)   SpO2 99%   BMI 25.69 kg/m    Subjective:    Patient ID: Gail Mullins, female    DOB: June 19, 1986, 38 y.o.   MRN: [id number]  HPI: Gail Mullins is a 38 y.o. female presenting on 08/02/2024 for comprehensive medical examination. Current medical complaints include:none  She currently lives with: husband and child Menopausal Symptoms: no  To be taking Detrol  LA, followed by urology with last visit on 06/21/24. Has not been taking any medications for 2-3 months as meds not covered or did not work. Had recent pap with GYN on 06/25/24 with HPV + and ASC-US , they recommended HPV vaccines.  History of iron deficiency anemia and Vitamin D  deficiency. Continues Prilosec for reflux issues.  MIGRAINES Takes Imitrex . Nurtec worked well for her, but insurance would not cover. Took a Fiorcet recently which worked well. Duration: chronic Onset: gradual Severity: worst 7/10 Quality: sharp, dull, aching, throbbing Frequency: intermittent Location: front to back Headache duration: 24 hours Radiation: no Time of day headache occurs: varies Alleviating factors: nothing Aggravating factors: nothing Headache status at time of visit: current headache Treatments attempted: Tylenol , Ibuprofen , Excedrin migraine Aura: yes Nausea:  no Vomiting: no Photophobia:  yes Phonophobia:  yes Effect on social functioning:  yes Numbers of missed days of school/work each month: 0 Confusion:  no Gait disturbance/ataxia:  no Behavioral changes:  no  Fevers:  no   DEPRESSION & PTSD Follows with psychiatry, [facility]. Taking Mirtazapine  and Propranolol . Has been a week since changes. Has been trying to limit social media time.  History of pancreatitis with Trintellix in February 2025. Mood status: stable Satisfied with current treatment?: yes Symptom severity: moderate  Duration  of current treatment : chronic Side effects: no Medication compliance: good compliance Psychotherapy/counseling: yes in past Depressed mood: yes Anxious mood: a little bit Anhedonia: yes, has not read a book in two months Significant weight loss or gain: no Insomnia: last few weeks has struggled some -- has a 38 year old Fatigue: yes Feelings of worthlessness or guilt: yes Impaired concentration/indecisiveness: a little bit Suicidal ideations: no Hopelessness: yes Crying spells: yes    08/02/2024   10:31 AM 06/01/2024    2:05 PM 05/07/2024    9:45 AM 03/31/2024    1:06 PM 02/11/2024    1:15 PM  Depression screen PHQ 2/9  Decreased Interest 2 1 1 1  0  Down, Depressed, Hopeless 2 1 1 1 1   PHQ - 2 Score 4 2 2 2 1   Altered sleeping 3 2 2 1 1   Tired, decreased energy 3 2 2 1 1   Change in appetite 3 2 2 1  0  Feeling bad or failure about yourself  2 1 1 1 1   Trouble concentrating 3 1 2 2  0  Moving slowly or fidgety/restless 1 0 1 1 0  Suicidal thoughts 0 0 0 0   PHQ-9 Score 19 10 12 9 4   Difficult doing work/chores Very difficult Somewhat difficult Somewhat difficult Somewhat difficult Somewhat difficult      08/02/2024   10:31 AM 06/01/2024    2:06 PM 05/07/2024    9:45 AM 03/31/2024    1:07 PM  GAD 7 : Generalized Anxiety Score  Nervous, Anxious, on Edge 1 0 2 2  Control/stop worrying 1 0 2 1  Worry too much - different things 1 0 2 2  Trouble relaxing 1 1 2 2   Restless 2 0 2 1  Easily annoyed or irritable 3 1 2 1   Afraid - awful might happen 2 1 2 1   Total GAD 7 Score 11 3 14 10   Anxiety Difficulty Somewhat difficult Somewhat difficult Somewhat difficult Somewhat difficult      12/16/2023    3:24 PM 01/30/2024    9:58 AM 03/31/2024    1:06 PM 06/01/2024    2:05 PM 08/02/2024   10:31 AM  Fall Risk  Falls in the past year? 0 0 0 0 0  Was there an injury with Fall? 0 0 0 0 0  Fall Risk Category Calculator 0 0 0 0 0  Patient at Risk for Falls Due to No Fall Risks No Fall  Risks No Fall Risks No Fall Risks No Fall Risks  Fall risk Follow up Falls evaluation completed Falls evaluation completed Falls evaluation completed Falls evaluation completed Falls evaluation completed    Past Medical History:  Past Medical History:  Diagnosis Date   Alcohol abuse    Anemia July 2024   N/A   Anxiety    Depression    Substance abuse (HCC) 2003   N/A    Surgical History:  Past Surgical History:  Procedure Laterality Date   ESOPHAGOGASTRODUODENOSCOPY N/A 08/15/2019   Procedure: ESOPHAGOGASTRODUODENOSCOPY (EGD);  Surgeon: Toledo, Ladell POUR, MD;  Location: [facility] ENDOSCOPY;  Service: Gastroenterology;  Laterality: N/A;    Medications:  Current Outpatient Medications on File Prior to Visit  Medication Sig   mirtazapine  (REMERON ) 15 MG tablet Take by mouth.   omeprazole  (PRILOSEC) 20 MG capsule Take 1 capsule (20 mg total) by mouth daily.   ondansetron  (ZOFRAN -ODT) 4 MG disintegrating tablet Take 1 tablet (4 mg total) by mouth every 8 (eight) hours as needed for nausea or vomiting.   OVER THE COUNTER MEDICATION Take 1 tablet by mouth daily. Blood Builder   propranolol  (INDERAL ) 40 MG tablet Take 40 mg by mouth at bedtime as needed.   SUMAtriptan  (IMITREX ) 50 MG tablet TAKE 1 TABLET BY MOUTH EVERY 2 HOURS AS NEEDED MIGRAINE MAY REPEAT IN 2 HOURSIF NEEDED   tolterodine  (DETROL  LA) 4 MG 24 hr capsule Take 1 capsule (4 mg total) by mouth daily.   No current facility-administered medications on file prior to visit.    Allergies:  Allergies  Allergen Reactions   Trintellix [Vortioxetine] Other (See Comments)    Pancreatitis    Social History:  Social History   Socioeconomic History   Marital status: Married    Spouse name: Bernardino   Number of children: Not on file   Years of education: Not on file   Highest education level: Bachelor's degree (e.g., BA, AB, BS)  Occupational History   Not on file  Tobacco Use   Smoking status: Former    Current packs/day: 0.50     Average packs/day: 0.5 packs/day for 15.0 years (7.5 ttl pk-yrs)    Types: Cigarettes    Passive exposure: Current   Smokeless tobacco: Never  Vaping Use   Vaping status: Every Day  Substance and Sexual Activity   Alcohol use: Not Currently   Drug use: Not Currently   Sexual activity: Yes  Other Topics Concern   Not on file  Social History Narrative   Not on file   Social Drivers of Health   Financial Resource Strain: Low Risk  (03/31/2024)   Overall Financial  Resource Strain (CARDIA)    Difficulty of Paying Living Expenses: Not hard at all  Food Insecurity: No Food Insecurity (03/31/2024)   Hunger Vital Sign    Worried About Running Out of Food in the Last Year: Never true    Ran Out of Food in the Last Year: Never true  Transportation Needs: No Transportation Needs (03/31/2024)   PRAPARE - Administrator, Civil Service (Medical): No    Lack of Transportation (Non-Medical): No  Physical Activity: Inactive (03/31/2024)   Exercise Vital Sign    Days of Exercise per Week: 0 days    Minutes of Exercise per Session: Not on file  Stress: Stress Concern Present (03/31/2024)   [employer] of Occupational Health - Occupational Stress Questionnaire    Feeling of Stress: To some extent  Social Connections: Socially Isolated (03/31/2024)   Social Connection and Isolation Panel    Frequency of Communication with Friends and Family: Never    Frequency of Social Gatherings with Friends and Family: Once a week    Attends Religious Services: Never    Database Administrator or Organizations: No    Attends Engineer, Structural: Not on file    Marital Status: Married  Catering Manager Violence: Not on file   Social History   Tobacco Use  Smoking Status Former   Current packs/day: 0.50   Average packs/day: 0.5 packs/day for 15.0 years (7.5 ttl pk-yrs)   Types: Cigarettes   Passive exposure: Current  Smokeless Tobacco Never   Social History   Substance and  Sexual Activity  Alcohol Use Not Currently    Family History:  Family History  Problem Relation Age of Onset   Breast cancer Neg Hx     Past medical history, surgical history, medications, allergies, family history and social history reviewed with patient today and changes made to appropriate areas of the chart.   ROS All other ROS negative except what is listed above and in the HPI.      Objective:    BP 99/65   Pulse 71   Temp 98.3 F (36.8 C) (Oral)   Ht 5' 7 (1.702 m)   Wt 164 lb (74.4 kg)   LMP  (LMP Unknown)   SpO2 99%   BMI 25.69 kg/m   Wt Readings from Last 3 Encounters:  08/02/24 164 lb (74.4 kg)  06/01/24 167 lb 12.8 oz (76.1 kg)  05/07/24 171 lb 6.4 oz (77.7 kg)    Physical Exam Vitals and nursing note reviewed.  Constitutional:      General: She is awake. She is not in acute distress.    Appearance: She is well-developed and well-groomed. She is not ill-appearing or toxic-appearing.  HENT:     Head: Normocephalic and atraumatic.     Right Ear: Hearing, tympanic membrane, ear canal and external ear normal. No drainage.     Left Ear: Hearing, tympanic membrane, ear canal and external ear normal. No drainage.     Nose: Nose normal.     Right Sinus: No maxillary sinus tenderness or frontal sinus tenderness.     Left Sinus: No maxillary sinus tenderness or frontal sinus tenderness.     Mouth/Throat:     Mouth: Mucous membranes are moist.     Pharynx: Oropharynx is clear. Uvula midline. No pharyngeal swelling, oropharyngeal exudate or posterior oropharyngeal erythema.  Eyes:     General: Lids are normal.        Right eye: No discharge.  Left eye: No discharge.     Extraocular Movements: Extraocular movements intact.     Conjunctiva/sclera: Conjunctivae normal.     Pupils: Pupils are equal, round, and reactive to light.     Visual Fields: Right eye visual fields normal and left eye visual fields normal.  Neck:     Thyroid: No thyromegaly.      Vascular: No carotid bruit.     Trachea: Trachea normal.  Cardiovascular:     Rate and Rhythm: Normal rate and regular rhythm.     Heart sounds: Normal heart sounds. No murmur heard.    No gallop.  Pulmonary:     Effort: Pulmonary effort is normal. No accessory muscle usage or respiratory distress.     Breath sounds: Normal breath sounds.  Abdominal:     General: Bowel sounds are normal.     Palpations: Abdomen is soft. There is no hepatomegaly or splenomegaly.     Tenderness: There is no abdominal tenderness.  Musculoskeletal:        General: Normal range of motion.     Cervical back: Normal range of motion and neck supple.     Right lower leg: No edema.     Left lower leg: No edema.  Lymphadenopathy:     Head:     Right side of head: No submental, submandibular, tonsillar, preauricular or posterior auricular adenopathy.     Left side of head: No submental, submandibular, tonsillar, preauricular or posterior auricular adenopathy.     Cervical: No cervical adenopathy.  Skin:    General: Skin is warm and dry.     Capillary Refill: Capillary refill takes less than 2 seconds.     Findings: No rash.  Neurological:     Mental Status: She is alert and oriented to person, place, and time.     Gait: Gait is intact.     Deep Tendon Reflexes: Reflexes are normal and symmetric.     Reflex Scores:      Brachioradialis reflexes are 2+ on the right side and 2+ on the left side.      Patellar reflexes are 2+ on the right side and 2+ on the left side. Psychiatric:        Attention and Perception: Attention normal.        Mood and Affect: Mood normal.        Speech: Speech normal.        Behavior: Behavior normal. Behavior is cooperative.        Thought Content: Thought content normal.        Judgment: Judgment normal.     Results for orders placed or performed in visit on 08/02/24  Cytology - PAP   Collection Time: 06/25/24 12:00 AM  Result Value Ref Range   HPV Aptima Positive     CYTOLOGY - PAP ASC-US        Assessment & Plan:   Problem List Items Addressed This Visit       Cardiovascular and Mediastinum   Chronic migraine with aura without status migrainosus, not intractable   Chronic, stable.  Drastic reduction in migraines with Nurtec as maintenance, but insurance will not cover as needed or for maintenance. Did not improve with Imitrex , Maxalt , Gabapentin , Amitriptyline in past.  No neuro red flags on exam.  She will continue Imitrex  for acute + Fioricet.  Would avoid BB or ACE/ARB due to baseline lower BP.  Due to mental health medications limits what can be used.  We discussed injectables,  but she prefers a pill and unsure these would be covered.  May need imaging in future if worsening headaches present -- MRI brain and possibly imaging cervical spine.      Relevant Medications   mirtazapine  (REMERON ) 15 MG tablet   propranolol  (INDERAL ) 40 MG tablet   butalbital-acetaminophen -caffeine (FIORICET) 50-325-40 MG tablet   Other Relevant Orders   TSH     Digestive   GERD (gastroesophageal reflux disease)   Chronic, stable with PPI on board. Risks of PPI use were discussed with patient including bone loss, C. Diff diarrhea, pneumonia, infections, CKD, electrolyte abnormalities. Verbalizes understanding and chooses to continue the medication. Mag level annually.      Relevant Orders   Magnesium     Other   Vitamin D  deficiency   Chronic, ongoing.  Take supplement daily and check Vitamin D  level today.      Relevant Orders   VITAMIN D  25 Hydroxy (Vit-D Deficiency, Fractures)   Vapes nicotine  containing substance   I have recommended complete cessation of tobacco use. I have discussed various options available for assistance with tobacco cessation including over the counter methods (Nicotine  gum, patch and lozenges). We also discussed prescription options (Chantix, Nicotine  Inhaler / Nasal Spray). The patient is not interested in pursuing any prescription  tobacco cessation options at this time.       Urgency incontinence   Ongoing.  Continue to collaborate with urology and UroGyn upcoming.  Currently no medications taken.      PTSD (post-traumatic stress disorder)   Chronic, ongoing.  Refer to depression plan of care.      Relevant Medications   mirtazapine  (REMERON ) 15 MG tablet   Iron deficiency   Chronic, is taking supplement at this time.  Continue supplement.  Will recheck labs today.      Relevant Orders   CBC with Differential/Platelet   Ferritin   Iron   Elevated low density lipoprotein (LDL) cholesterol level   Noted on past labs, recheck today.  Continue focus on diet and regular exercise.      Relevant Orders   Comprehensive metabolic panel with GFR   Lipid Panel w/o Chol/HDL Ratio   Depression, major, single episode, moderate (HCC) - Primary   Chronic, ongoing.  Followed by psychiatry in [place].  Denies SI/HI today.  Will continue collaboration with psychiatry and return to therapy as needed. They have discussed alternate treatments with her.      Relevant Medications   mirtazapine  (REMERON ) 15 MG tablet   Other Relevant Orders   TSH   Other Visit Diagnoses       Encounter for annual physical exam       Annual physical today with labs and health maintenance reviewed, discussed with patient.        Follow up plan: Return in about 6 months (around 01/31/2025) for Depression, ANXIETY.   LABORATORY TESTING:  - Pap smear: up to date  IMMUNIZATIONS:   - Tdap: Tetanus vaccination status reviewed: last tetanus booster within 10 years. - Influenza: Refused - Pneumovax: Not applicable - Prevnar: Not applicable - COVID: Refused - HPV: Refused - she is aware she can obtain here - Shingrix vaccine: Not applicable  SCREENING: -Mammogram: Refused  - Colonoscopy: Refused  - Bone Density: Refused  -Hearing Test: Refused  -Spirometry: Refused   PATIENT COUNSELING:   Advised to take 1 mg of folate  supplement per day if capable of pregnancy.   Sexuality: Discussed sexually transmitted diseases, partner selection, use of  condoms, avoidance of unintended pregnancy  and contraceptive alternatives.   Advised to avoid cigarette smoking.  I discussed with the patient that most people either abstain from alcohol or drink within safe limits (<=14/week and <=4 drinks/occasion for males, <=7/weeks and <= 3 drinks/occasion for females) and that the risk for alcohol disorders and other health effects rises proportionally with the number of drinks per week and how often a drinker exceeds daily limits.  Discussed cessation/primary prevention of drug use and availability of treatment for abuse.   Diet: Encouraged to adjust caloric intake to maintain  or achieve ideal body weight, to reduce intake of dietary saturated fat and total fat, to limit sodium intake by avoiding high sodium foods and not adding table salt, and to maintain adequate dietary potassium and calcium  preferably from fresh fruits, vegetables, and low-fat dairy products.    Stressed the importance of regular exercise  Injury prevention: Discussed safety belts, safety helmets, smoke detector, smoking near bedding or upholstery.   Dental health: Discussed importance of regular tooth brushing, flossing, and dental visits.    NEXT PREVENTATIVE PHYSICAL DUE IN 1 YEAR. Return in about 6 months (around 01/31/2025) for Depression, ANXIETY.

## 2024-08-02 NOTE — Assessment & Plan Note (Signed)
 Chronic, ongoing.  Followed by psychiatry in [place].  Denies SI/HI today.  Will continue collaboration with psychiatry and return to therapy as needed. They have discussed alternate treatments with her.

## 2024-08-02 NOTE — Assessment & Plan Note (Signed)
 Ongoing.  Continue to collaborate with urology and UroGyn upcoming.  Currently no medications taken.

## 2024-08-02 NOTE — Assessment & Plan Note (Signed)
 Chronic, stable with PPI on board. Risks of PPI use were discussed with patient including bone loss, C. Diff diarrhea, pneumonia, infections, CKD, electrolyte abnormalities. Verbalizes understanding and chooses to continue the medication. Mag level annually.

## 2024-08-02 NOTE — Assessment & Plan Note (Signed)
Chronic, ongoing.  Refer to depression plan of care.

## 2024-08-02 NOTE — Assessment & Plan Note (Signed)
 Chronic, stable.  Drastic reduction in migraines with Nurtec as maintenance, but insurance will not cover as needed or for maintenance. Did not improve with Imitrex , Maxalt , Gabapentin , Amitriptyline in past.  No neuro red flags on exam.  She will continue Imitrex  for acute + Fioricet.  Would avoid BB or ACE/ARB due to baseline lower BP.  Due to mental health medications limits what can be used.  We discussed injectables, but she prefers a pill and unsure these would be covered.  May need imaging in future if worsening headaches present -- MRI brain and possibly imaging cervical spine.

## 2024-08-02 NOTE — Assessment & Plan Note (Signed)
 Noted on past labs, recheck today.  Continue focus on diet and regular exercise.

## 2024-08-02 NOTE — Assessment & Plan Note (Signed)
 Chronic, is taking supplement at this time.  Continue supplement.  Will recheck labs today.

## 2024-08-02 NOTE — Assessment & Plan Note (Signed)
 I have recommended complete cessation of tobacco use. I have discussed various options available for assistance with tobacco cessation including over the counter methods (Nicotine gum, patch and lozenges). We also discussed prescription options (Chantix, Nicotine Inhaler / Nasal Spray). The patient is not interested in pursuing any prescription tobacco cessation options at this time.

## 2024-08-02 NOTE — Assessment & Plan Note (Signed)
 Chronic, ongoing.  Take supplement daily and check Vitamin D  level today.

## 2024-08-03 ENCOUNTER — Ambulatory Visit: Payer: Self-pay | Admitting: Nurse Practitioner

## 2024-08-03 DIAGNOSIS — R7989 Other specified abnormal findings of blood chemistry: Secondary | ICD-10-CM

## 2024-08-03 LAB — CBC WITH DIFFERENTIAL/PLATELET
Basophils Absolute: 0.1 x10E3/uL (ref 0.0–0.2)
Basos: 1 %
EOS (ABSOLUTE): 0.3 x10E3/uL (ref 0.0–0.4)
Eos: 2 %
Hematocrit: 40.7 % (ref 34.0–46.6)
Hemoglobin: 12.4 g/dL (ref 11.1–15.9)
Immature Grans (Abs): 0 x10E3/uL (ref 0.0–0.1)
Immature Granulocytes: 0 %
Lymphocytes Absolute: 2.8 x10E3/uL (ref 0.7–3.1)
Lymphs: 24 %
MCH: 28.6 pg (ref 26.6–33.0)
MCHC: 30.5 g/dL — ABNORMAL LOW (ref 31.5–35.7)
MCV: 94 fL (ref 79–97)
Monocytes Absolute: 0.5 x10E3/uL (ref 0.1–0.9)
Monocytes: 4 %
Neutrophils Absolute: 8 x10E3/uL — ABNORMAL HIGH (ref 1.4–7.0)
Neutrophils: 69 %
Platelets: 378 x10E3/uL (ref 150–450)
RBC: 4.34 x10E6/uL (ref 3.77–5.28)
RDW: 13.1 % (ref 11.7–15.4)
WBC: 11.6 x10E3/uL — ABNORMAL HIGH (ref 3.4–10.8)

## 2024-08-03 LAB — FERRITIN: Ferritin: 27 ng/mL (ref 15–150)

## 2024-08-03 LAB — LIPID PANEL W/O CHOL/HDL RATIO
Cholesterol, Total: 207 mg/dL — ABNORMAL HIGH (ref 100–199)
HDL: 46 mg/dL (ref >39)
LDL Chol Calc (NIH): 135 mg/dL — ABNORMAL HIGH (ref 0–99)
Triglycerides: 143 mg/dL (ref 0–149)
VLDL Cholesterol Cal: 26 mg/dL (ref 5–40)

## 2024-08-03 LAB — COMPREHENSIVE METABOLIC PANEL WITH GFR
ALT: 9 IU/L (ref 0–32)
AST: 16 IU/L (ref 0–40)
Albumin: 4.6 g/dL (ref 3.9–4.9)
Alkaline Phosphatase: 61 IU/L (ref 41–116)
BUN/Creatinine Ratio: 13 (ref 9–23)
BUN: 8 mg/dL (ref 6–20)
Bilirubin Total: <0.2 mg/dL (ref 0.0–1.2)
CO2: 23 mmol/L (ref 20–29)
Calcium: 9.8 mg/dL (ref 8.7–10.2)
Chloride: 103 mmol/L (ref 96–106)
Creatinine, Ser: 0.64 mg/dL (ref 0.57–1.00)
Globulin, Total: 2.5 g/dL (ref 1.5–4.5)
Glucose: 76 mg/dL (ref 70–99)
Potassium: 4.8 mmol/L (ref 3.5–5.2)
Sodium: 138 mmol/L (ref 134–144)
Total Protein: 7.1 g/dL (ref 6.0–8.5)
eGFR: 116 mL/min/1.73 (ref >59)

## 2024-08-03 LAB — TSH: TSH: 1 u[IU]/mL (ref 0.450–4.500)

## 2024-08-03 LAB — MAGNESIUM: Magnesium: 2.5 mg/dL — ABNORMAL HIGH (ref 1.6–2.3)

## 2024-08-03 LAB — VITAMIN D 25 HYDROXY (VIT D DEFICIENCY, FRACTURES): Vit D, 25-Hydroxy: 23.1 ng/mL — ABNORMAL LOW (ref 30.0–100.0)

## 2024-08-03 LAB — IRON: Iron: 41 ug/dL (ref 27–159)

## 2024-08-03 NOTE — Progress Notes (Signed)
 Contacted via MyChart -- need lab only visit in 4 weeks please.  Good afternoon Gail Mullins, your labs have returned: - CBC shows mild elevation in white blood cells and neutrophils.  Have you been sick recently? I would like to recheck this outpatient in 4 weeks to see if trend back down. - Lipid panel continue to show elevations, but no medications needed at this time. They have come down a little this check. Continue focus on diet and exercise. - Magnesium a little elevated, very mild, if taking any supplements with magnesium cut back on these. - Vitamin D  level a little low, continue supplement Vitamin D3 2000 units daily. - Remainder of labs stable.  Any questions? Keep being amazing!!  Thank you for allowing me to participate in your care.  I appreciate you. Kindest regards, Ireland Chagnon

## 2024-08-04 NOTE — Progress Notes (Signed)
 Scheduled

## 2024-08-31 ENCOUNTER — Other Ambulatory Visit

## 2024-08-31 DIAGNOSIS — R7989 Other specified abnormal findings of blood chemistry: Secondary | ICD-10-CM

## 2024-09-01 ENCOUNTER — Ambulatory Visit: Payer: Self-pay | Admitting: Nurse Practitioner

## 2024-09-01 DIAGNOSIS — R7989 Other specified abnormal findings of blood chemistry: Secondary | ICD-10-CM

## 2024-09-01 LAB — CBC WITH DIFFERENTIAL/PLATELET
Basophils Absolute: 0.1 x10E3/uL (ref 0.0–0.2)
Basos: 1 %
EOS (ABSOLUTE): 0.6 x10E3/uL — ABNORMAL HIGH (ref 0.0–0.4)
Eos: 5 %
Hematocrit: 37.5 % (ref 34.0–46.6)
Hemoglobin: 11.8 g/dL (ref 11.1–15.9)
Immature Grans (Abs): 0 x10E3/uL (ref 0.0–0.1)
Immature Granulocytes: 0 %
Lymphocytes Absolute: 3.3 x10E3/uL — ABNORMAL HIGH (ref 0.7–3.1)
Lymphs: 30 %
MCH: 29.3 pg (ref 26.6–33.0)
MCHC: 31.5 g/dL (ref 31.5–35.7)
MCV: 93 fL (ref 79–97)
Monocytes Absolute: 0.6 x10E3/uL (ref 0.1–0.9)
Monocytes: 5 %
Neutrophils Absolute: 6.6 x10E3/uL (ref 1.4–7.0)
Neutrophils: 59 %
Platelets: 319 x10E3/uL (ref 150–450)
RBC: 4.03 x10E6/uL (ref 3.77–5.28)
RDW: 13.2 % (ref 11.7–15.4)
WBC: 11.2 x10E3/uL — ABNORMAL HIGH (ref 3.4–10.8)

## 2024-09-01 NOTE — Telephone Encounter (Signed)
 Lab appt scheduled.

## 2024-09-13 ENCOUNTER — Ambulatory Visit: Admitting: Urology

## 2024-10-04 ENCOUNTER — Other Ambulatory Visit

## 2024-10-04 DIAGNOSIS — R7989 Other specified abnormal findings of blood chemistry: Secondary | ICD-10-CM

## 2024-10-05 ENCOUNTER — Ambulatory Visit: Payer: Self-pay | Admitting: Nurse Practitioner

## 2024-10-05 LAB — CBC WITH DIFFERENTIAL/PLATELET
Basophils Absolute: 0.1 x10E3/uL (ref 0.0–0.2)
Basos: 1 %
EOS (ABSOLUTE): 0.5 x10E3/uL — ABNORMAL HIGH (ref 0.0–0.4)
Eos: 5 %
Hematocrit: 37.9 % (ref 34.0–46.6)
Hemoglobin: 12.1 g/dL (ref 11.1–15.9)
Immature Grans (Abs): 0 x10E3/uL (ref 0.0–0.1)
Immature Granulocytes: 0 %
Lymphocytes Absolute: 2.7 x10E3/uL (ref 0.7–3.1)
Lymphs: 29 %
MCH: 29.3 pg (ref 26.6–33.0)
MCHC: 31.9 g/dL (ref 31.5–35.7)
MCV: 92 fL (ref 79–97)
Monocytes Absolute: 0.5 x10E3/uL (ref 0.1–0.9)
Monocytes: 5 %
Neutrophils Absolute: 5.5 x10E3/uL (ref 1.4–7.0)
Neutrophils: 60 %
Platelets: 333 x10E3/uL (ref 150–450)
RBC: 4.13 x10E6/uL (ref 3.77–5.28)
RDW: 13.3 % (ref 11.7–15.4)
WBC: 9.2 x10E3/uL (ref 3.4–10.8)

## 2024-10-05 LAB — C-REACTIVE PROTEIN: CRP: 2 mg/L (ref 0–10)

## 2024-10-05 LAB — SEDIMENTATION RATE: Sed Rate: 40 mm/h — ABNORMAL HIGH (ref 0–32)

## 2024-10-05 NOTE — Progress Notes (Signed)
 Contacted via MyChart  Good morning Gail Mullins, your labs have returned and white blood cell count has returned to normal, still mild elevation in eosinophils which we at times see with allergies or asthma. We will continue to monitor. CRP is normal and ESR is slightly elevated, these are inflammatory markers and overall are reassuring. Great news!! Any questions? Keep being amazing!!  Thank you for allowing me to participate in your care.  I appreciate you. Kindest regards, Skippy Marhefka

## 2024-10-18 ENCOUNTER — Ambulatory Visit: Admitting: Urology

## 2024-10-25 ENCOUNTER — Encounter: Payer: Self-pay | Admitting: Internal Medicine

## 2024-11-02 ENCOUNTER — Ambulatory Visit: Admitting: Anesthesiology

## 2024-11-02 ENCOUNTER — Encounter
Admission: RE | Disposition: A | Discharge status: Home / Self Care | Payer: Self-pay | Source: Home / Self Care | Attending: Internal Medicine

## 2024-11-02 ENCOUNTER — Ambulatory Visit: Admission: RE | Admit: 2024-11-02 | Source: Home / Self Care | Admitting: Internal Medicine

## 2024-11-02 ENCOUNTER — Other Ambulatory Visit: Payer: Self-pay

## 2024-11-02 ENCOUNTER — Encounter: Payer: Self-pay | Admitting: Internal Medicine

## 2024-11-02 DIAGNOSIS — K640 First degree hemorrhoids: Secondary | ICD-10-CM | POA: Insufficient documentation

## 2024-11-02 DIAGNOSIS — K219 Gastro-esophageal reflux disease without esophagitis: Secondary | ICD-10-CM | POA: Insufficient documentation

## 2024-11-02 DIAGNOSIS — K2970 Gastritis, unspecified, without bleeding: Secondary | ICD-10-CM | POA: Insufficient documentation

## 2024-11-02 DIAGNOSIS — K529 Noninfective gastroenteritis and colitis, unspecified: Secondary | ICD-10-CM | POA: Insufficient documentation

## 2024-11-02 DIAGNOSIS — R634 Abnormal weight loss: Secondary | ICD-10-CM | POA: Diagnosis not present

## 2024-11-02 DIAGNOSIS — K635 Polyp of colon: Secondary | ICD-10-CM | POA: Insufficient documentation

## 2024-11-02 DIAGNOSIS — F1290 Cannabis use, unspecified, uncomplicated: Secondary | ICD-10-CM | POA: Diagnosis not present

## 2024-11-02 DIAGNOSIS — Z87891 Personal history of nicotine dependence: Secondary | ICD-10-CM | POA: Insufficient documentation

## 2024-11-02 HISTORY — DX: Gastro-esophageal reflux disease without esophagitis: K21.9

## 2024-11-02 HISTORY — DX: Gastrointestinal hemorrhage, unspecified: K92.2

## 2024-11-02 LAB — POCT PREGNANCY, URINE: Preg Test, Ur: NEGATIVE

## 2024-11-02 MED ORDER — ONDANSETRON HCL 4 MG/2ML IJ SOLN
INTRAMUSCULAR | Status: AC
  Filled 2024-11-02: qty 2

## 2024-11-02 MED ORDER — GLYCOPYRROLATE 0.2 MG/ML IJ SOLN
INTRAMUSCULAR | Status: AC
  Filled 2024-11-02: qty 1

## 2024-11-02 MED ORDER — GLYCOPYRROLATE 0.2 MG/ML IJ SOLN
INTRAMUSCULAR | Status: DC | PRN
  Administered 2024-11-02: .2 mg via INTRAVENOUS

## 2024-11-02 MED ORDER — LIDOCAINE HCL (PF) 2 % IJ SOLN
INTRAMUSCULAR | Status: AC
  Filled 2024-11-02: qty 5

## 2024-11-02 MED ORDER — SODIUM CHLORIDE 0.9 % IV SOLN: INTRAVENOUS | Status: DC

## 2024-11-02 MED ORDER — PROPOFOL 10 MG/ML IV BOLUS
INTRAVENOUS | Status: DC | PRN
  Administered 2024-11-02 (×3): 50 mg via INTRAVENOUS
  Administered 2024-11-02: 70 mg via INTRAVENOUS
  Administered 2024-11-02: 50 mg via INTRAVENOUS

## 2024-11-02 MED ORDER — LIDOCAINE HCL (CARDIAC) PF 100 MG/5ML IV SOSY
PREFILLED_SYRINGE | INTRAVENOUS | Status: DC | PRN
  Administered 2024-11-02: 70 mg via INTRAVENOUS

## 2024-11-02 MED ORDER — PROPOFOL 500 MG/50ML IV EMUL
INTRAVENOUS | Status: DC | PRN
  Administered 2024-11-02: 150 ug/kg/min via INTRAVENOUS

## 2024-11-02 MED ORDER — DEXMEDETOMIDINE HCL IN NACL 80 MCG/20ML IV SOLN
INTRAVENOUS | Status: DC | PRN
  Administered 2024-11-02: 8 ug via INTRAVENOUS
  Administered 2024-11-02: 12 ug via INTRAVENOUS

## 2024-11-02 NOTE — Op Note (Signed)
 [facility] Gastroenterology Patient Name: Gail Mullins Procedure Date: 11/02/2024 8:55 AM MRN: [id number] Account #: [id number] Date of Birth: [birth year] Admit Type: Outpatient Age: 39 Room: [facility] ENDO ROOM 1 Gender: Female Note Status: Finalized Instrument Name: Endoscope [id number] Procedure:             Upper GI endoscopy Indications:           Suspected gastro-esophageal reflux disease, Diarrhea,                         Nausea with vomiting, Weight loss Providers:             Isaul Landi K. Aundria MD, MD Referring MD:          Melanie DASEN. Cannady (Referring MD) Medicines:             Propofol  per Anesthesia Complications:         No immediate complications. Estimated blood loss:                         Minimal. Procedure:             Pre-Anesthesia Assessment:                        - The risks and benefits of the procedure and the                         sedation options and risks were discussed with the                         patient. All questions were answered and informed                         consent was obtained.                        - Patient identification and proposed procedure were                         verified prior to the procedure by the nurse. The                         procedure was verified in the procedure room.                        - ASA Grade Assessment: II - A patient with mild                         systemic disease.                        - After reviewing the risks and benefits, the patient                         was deemed in satisfactory condition to undergo the                         procedure.                        After obtaining informed consent,  the endoscope was                         passed under direct vision. Throughout the procedure,                         the patient's blood pressure, pulse, and oxygen                         saturations were monitored continuously. The Endoscope                         was  introduced through the mouth, and advanced to the                         third part of duodenum. The upper GI endoscopy was                         accomplished without difficulty. The patient tolerated                         the procedure well. Findings:      The esophagus was normal.      Patchy mild inflammation characterized by erosions and erythema was       found in the gastric antrum. Biopsies were taken with a cold forceps for       Helicobacter pylori testing. Estimated blood loss was minimal.      The cardia and gastric fundus were normal on retroflexion.      The exam of the stomach was otherwise normal.      The examined duodenum was normal. Biopsies for histology were taken with       a cold forceps for evaluation of celiac disease. Two biopsies were       obtained with cold forceps for histology in the duodenal bulb, as well       as two biopsies in the second portion of the duodenum and two biopsies       in the third portion of the duodenum. Estimated blood loss was minimal. Impression:            - Normal esophagus.                        - Gastritis. Biopsied.                        - Normal examined duodenum. Biopsied.                        - Biopsies performed in the duodenal bulb, in the                         second portion of the duodenum and in the third                         portion of the duodenum. Recommendation:        - Await pathology results.                        - Proceed with colonoscopy Procedure Code(s):     --- Professional ---  56760, Esophagogastroduodenoscopy, flexible,                         transoral; with biopsy, single or multiple Diagnosis Code(s):     --- Professional ---                        K29.70, Gastritis, unspecified, without bleeding                        R19.7, Diarrhea, unspecified                        R11.2, Nausea with vomiting, unspecified                        R63.4, Abnormal weight loss CPT  copyright 2022 American Medical Association. All rights reserved. The codes documented in this report are preliminary and upon coder review may  be revised to meet current compliance requirements. Ladell MARLA Boss MD, MD 11/02/2024 9:12:11 AM This report has been signed electronically. Number of Addenda: 0 Note Initiated On: 11/02/2024 8:55 AM Estimated Blood Loss:  Estimated blood loss was minimal.      [facility]

## 2024-11-02 NOTE — Op Note (Signed)
 [facility] Gastroenterology Patient Name: Gail Mullins Procedure Date: 11/02/2024 8:54 AM MRN: [id number] Account #: [id number] Date of Birth: 1986/06/01 Admit Type: Outpatient Age: 38 Room: [facility] ENDO ROOM 1 Gender: Female Note Status: Finalized Instrument Name: Colon Scope [id number] Procedure:             Colonoscopy Indications:           Chronic diarrhea, Weight loss Providers:             Dorman Calderwood K. Aundria MD, MD Referring MD:          Melanie DASEN. Cannady (Referring MD) Medicines:             Propofol  per Anesthesia Complications:         No immediate complications. Estimated blood loss:                         Minimal. Procedure:             Pre-Anesthesia Assessment:                        - The risks and benefits of the procedure and the                         sedation options and risks were discussed with the                         patient. All questions were answered and informed                         consent was obtained.                        - Patient identification and proposed procedure were                         verified prior to the procedure by the nurse. The                         procedure was verified in the procedure room.                        - ASA Grade Assessment: II - A patient with mild                         systemic disease.                        - After reviewing the risks and benefits, the patient                         was deemed in satisfactory condition to undergo the                         procedure.                        After obtaining informed consent, the colonoscope was                         passed under direct vision.  Throughout the procedure,                         the patient's blood pressure, pulse, and oxygen                         saturations were monitored continuously. The                         Colonoscope was introduced through the anus and                         advanced to the the  terminal ileum, with                         identification of the appendiceal orifice and IC                         valve. The colonoscopy was performed without                         difficulty. The patient tolerated the procedure well.                         The quality of the bowel preparation was good. The                         terminal ileum, ileocecal valve, appendiceal orifice,                         and rectum were photographed. Findings:      The perianal and digital rectal examinations were normal. Pertinent       negatives include normal sphincter tone and no palpable rectal lesions.      Non-bleeding internal hemorrhoids were found during retroflexion. The       hemorrhoids were Grade I (internal hemorrhoids that do not prolapse).      A 12 mm polyp was found in the transverse colon. The polyp was       semi-pedunculated. The polyp was removed with a hot snare. Resection and       retrieval were complete. Estimated blood loss: none.      The exam was otherwise without abnormality.      Biopsies for histology were taken with a cold forceps from the right       colon, left colon and rectum for evaluation of microscopic colitis.       Estimated blood loss was minimal.      The terminal ileum appeared normal. Biopsies were taken with a cold       forceps for histology. Estimated blood loss was minimal. Impression:            - Non-bleeding internal hemorrhoids.                        - One 12 mm polyp in the transverse colon, removed                         with a hot snare. Resected and retrieved.                        -  The examination was otherwise normal.                        - The examined portion of the ileum was normal.                         Biopsied.                        - Biopsies were taken with a cold forceps from the                         right colon, left colon and rectum for evaluation of                         microscopic colitis. Recommendation:         - Await pathology results from EGD, also performed                         today.                        - Patient has a contact number available for                         emergencies. The signs and symptoms of potential                         delayed complications were discussed with the patient.                         Return to normal activities tomorrow. Written                         discharge instructions were provided to the patient.                        - Resume previous diet.                        - Continue present medications.                        - Await pathology results.                        - Repeat colonoscopy in 3 - 5 years for surveillance                         based on pathology results.                        - Follow up with Jonette Primmer, PA-C in the GI office.                         [phone]                        - Telephone GI office to schedule appointment in 3  months.                        - The findings and recommendations were discussed with                         the patient. Procedure Code(s):     --- Professional ---                        [phone], Colonoscopy, flexible; with removal of                         tumor(s), polyp(s), or other lesion(s) by snare                         technique                        45380, 59, Colonoscopy, flexible; with biopsy, single                         or multiple Diagnosis Code(s):     --- Professional ---                        R63.4, Abnormal weight loss                        K52.9, Noninfective gastroenteritis and colitis,                         unspecified                        K64.0, First degree hemorrhoids                        D12.3, Benign neoplasm of transverse colon (hepatic                         flexure or splenic flexure) CPT copyright 2022 American Medical Association. All rights reserved. The codes documented in this report are preliminary and  upon coder review may  be revised to meet current compliance requirements. Ladell MARLA Boss MD, MD 11/02/2024 9:32:55 AM This report has been signed electronically. Number of Addenda: 0 Note Initiated On: 11/02/2024 8:54 AM Scope Withdrawal Time: 0 hours 9 minutes 20 seconds  Total Procedure Duration: 0 hours 14 minutes 42 seconds  Estimated Blood Loss:  Estimated blood loss was minimal. Estimated blood loss                         was minimal.      [facility]

## 2024-11-02 NOTE — Transfer of Care (Signed)
 Immediate Anesthesia Transfer of Care Note  Patient: Gail Mullins  Procedure(s) Performed: COLONOSCOPY EGD (ESOPHAGOGASTRODUODENOSCOPY) BIOPSY, GI POLYPECTOMY, INTESTINE  Patient Location: PACU  Anesthesia Type:General  Level of Consciousness: sedated  Airway & Oxygen Therapy: Patient Spontanous Breathing  Post-op Assessment: Report given to RN and Post -op Vital signs reviewed and stable  Post vital signs: Reviewed and stable  Last Vitals:  Vitals Value Taken Time  BP 94/54 11/02/24 09:32  Temp 35.5 C 11/02/24 09:32  Pulse 68 11/02/24 09:32  Resp 15 11/02/24 09:32  SpO2 100 % 11/02/24 09:32  Vitals shown include unfiled device data.  Last Pain:  Vitals:   11/02/24 0932  TempSrc: Temporal  PainSc:          Complications: No notable events documented.

## 2024-11-02 NOTE — Anesthesia Preprocedure Evaluation (Signed)
"                                    Anesthesia Evaluation  Patient identified by MRN, date of birth, ID band Patient awake    Reviewed: Allergy & Precautions, H&P , NPO status , Patient's Chart, lab work & pertinent test results, reviewed documented beta blocker date and time   History of Anesthesia Complications Negative for: history of anesthetic complications  Airway Mallampati: I  TM Distance: >3 FB Neck ROM: full    Dental  (+) Dental Advidsory Given, Teeth Intact   Pulmonary neg shortness of breath, neg sleep apnea, neg COPD, neg recent URI, Current Smoker and Patient abstained from smoking.   Pulmonary exam normal breath sounds clear to auscultation       Cardiovascular Exercise Tolerance: Good negative cardio ROS Normal cardiovascular exam Rhythm:regular Rate:Normal     Neuro/Psych  PSYCHIATRIC DISORDERS Anxiety Depression    negative neurological ROS     GI/Hepatic Neg liver ROS,GERD  Controlled,,  Endo/Other  negative endocrine ROS    Renal/GU negative Renal ROS  negative genitourinary   Musculoskeletal   Abdominal   Peds  Hematology negative hematology ROS (+)   Anesthesia Other Findings Past Medical History: No date: Acute gastrointestinal hemorrhage No date: Alcohol abuse July 2024: Anemia     Comment:  N/A No date: Anxiety No date: Depression No date: GERD (gastroesophageal reflux disease) 2003: Substance abuse (HCC)     Comment:  N/A   Reproductive/Obstetrics negative OB ROS                              Anesthesia Physical Anesthesia Plan  ASA: 2  Anesthesia Plan: General   Post-op Pain Management:    Induction: Intravenous  PONV Risk Score and Plan: 2 and Propofol  infusion, TIVA and Treatment may vary due to age or medical condition  Airway Management Planned: Natural Airway and Nasal Cannula  Additional Equipment:   Intra-op Plan:   Post-operative Plan:   Informed Consent: I have  reviewed the patients History and Physical, chart, labs and discussed the procedure including the risks, benefits and alternatives for the proposed anesthesia with the patient or authorized representative who has indicated his/her understanding and acceptance.     Dental Advisory Given  Plan Discussed with: Anesthesiologist, CRNA and Surgeon  Anesthesia Plan Comments:          Anesthesia Quick Evaluation  "

## 2024-11-02 NOTE — Interval H&P Note (Signed)
 History and Physical Interval Note:  11/02/2024 8:51 AM  [name]  has presented today for surgery, with the diagnosis of Nausea and vomiting, unspecified vomiting type (R11.2) Loss of appetite (R63.0) Chronic diarrhea (K52.9) Gastroesophageal reflux disease, unspecified whether esophagitis present (K21.9) Unintentional weight loss (R63.4).  The various methods of treatment have been discussed with the patient and family. After consideration of risks, benefits and other options for treatment, the patient has consented to  Procedures: COLONOSCOPY (N/A) EGD (ESOPHAGOGASTRODUODENOSCOPY) (N/A) as a surgical intervention.  The patient's history has been reviewed, patient examined, no change in status, stable for surgery.  I have reviewed the patient's chart and labs.  Questions were answered to the patient's satisfaction.     [place], Ardit Danh

## 2024-11-02 NOTE — H&P (Signed)
 Outpatient short stay form Pre-procedure 11/02/2024 8:49 AM Gail Mullins, M.D.  Primary Physician: Jolene Cannady, NP  Reason for visit:  Nausea, vomiting, diarrhea, weight loss, GERD  History of present illness:  Ms. Andrew presents to the [facility] at the request of Jolene Cannady, NP, at [place] for chief complaint of chronic nausea, vomiting, and diarrhea. She reports symptoms have been ongoing for just over 1-year. She saw her PCP earlier this year and endorsed significant issues with nausea and vomiting. She was placed on omeprazole  20 mg once daily and reports symptoms have been better since doing this. She is now having issues with severe nausea episode 2 times a month recently. She hasn't had much vomiting in the last several months. When the nausea episodes are really bad the only thing that helps is getting in a hot and steamy shower. She does not have significant heartburn or reflux symptoms. She denies any complaints of esophageal dysphagia, odynophagia, early satiety, hoarseness, or epigastric abdominal pain. She is concerned about a 25-30-lb unintentional weight loss over the past year. Her appetite is really poor. She reports she was diagnosed with episode of acute pancreatitis in Feb this year by her PCP attributed 2/2 Trintellix. She had elevated lipase 110s during this time. She did not get any imaging to confirm pancreatitis. She did have noncontrasted CT abd/pelvis in July this year which showed no acute abnormalities. She is concerned about chronic diarrhea over the past year. She can go anywhere from 3-5 times daily. Consistency of stool is usually very loose. There have been times where she has seen mucous in her stools. She denies any hematochezia, melena, steatorrhea, or fecal incontinence. She denies any abdominal pain associated with the diarrhea. No known family history of chronic GI illnesses or GI malignancies. She doesn't smoke cigarettes, but  does vape daily. She uses marijuana once daily. She denies any current EtOH consumption. She is not currently working. She lives at home with her husband and 64 year-old child. She did have EGD in 2020 for hematemesis which did comment on Mallory-Weiss tear which was injected.     Current Medications[1]  Medications Prior to Admission  Medication Sig Dispense Refill Last Dose/Taking   butalbital -acetaminophen -caffeine  (FIORICET) 50-325-40 MG tablet Take 1 tablet by mouth every 6 (six) hours as needed for headache. 14 tablet 2    mirtazapine  (REMERON ) 15 MG tablet Take by mouth.      omeprazole  (PRILOSEC) 20 MG capsule Take 1 capsule (20 mg total) by mouth daily. 90 capsule 3    ondansetron  (ZOFRAN -ODT) 4 MG disintegrating tablet Take 1 tablet (4 mg total) by mouth every 8 (eight) hours as needed for nausea or vomiting. 40 tablet 3    OVER THE COUNTER MEDICATION Take 1 tablet by mouth daily. Blood Builder      propranolol  (INDERAL ) 40 MG tablet Take 40 mg by mouth at bedtime as needed.      SUMAtriptan  (IMITREX ) 50 MG tablet TAKE 1 TABLET BY MOUTH EVERY 2 HOURS AS NEEDED MIGRAINE MAY REPEAT IN 2 HOURSIF NEEDED 20 tablet 0    tolterodine  (DETROL  LA) 4 MG 24 hr capsule Take 1 capsule (4 mg total) by mouth daily. 30 capsule 11      Allergies[2]   Past Medical History:  Diagnosis Date   Acute gastrointestinal hemorrhage    Alcohol abuse    Anemia July 2024   N/A   Anxiety    Depression    GERD (gastroesophageal reflux  disease)    Substance abuse (HCC) 2003   N/A    Review of systems:  Otherwise negative.    Physical Exam  Gen: Alert, oriented. Appears stated age.  HEENT: [place]/AT. PERRLA. Lungs: CTA, no wheezes. CV: RR nl S1, S2. Abd: soft, benign, no masses. BS+ Ext: No edema. Pulses 2+    Planned procedures: Proceed with EGD and colonoscopy. The patient understands the nature of the planned procedure, indications, risks, alternatives and potential complications including but  not limited to bleeding, infection, perforation, damage to internal organs and possible oversedation/side effects from anesthesia. The patient agrees and gives consent to proceed.  Please refer to procedure notes for findings, recommendations and patient disposition/instructions.     Saroya Riccobono K. Mullins, M.D. Gastroenterology 11/02/2024  8:49 AM          [1]  Current Facility-Administered Medications:    0.9 %  sodium chloride  infusion, , Intravenous, Continuous, Savina Olshefski K, MD, Last Rate: 20 mL/hr at 11/02/24 9160, Continued from Pre-op at 11/02/24 0839 [2]  Allergies Allergen Reactions   Trintellix [Vortioxetine] Other (See Comments)    Pancreatitis

## 2024-11-03 LAB — SURGICAL PATHOLOGY

## 2024-11-05 NOTE — Therapy (Unsigned)
 " OUTPATIENT PHYSICAL THERAPY FEMALE PELVIC EVALUATION   Patient Name: Gail Mullins MRN: [id number] DOB:[birth year], 39 y.o., female Today's Date: 11/05/2024  END OF SESSION:   Past Medical History:  Diagnosis Date   Acute gastrointestinal hemorrhage    Alcohol abuse    Anemia July 2024   N/A   Anxiety    Depression    GERD (gastroesophageal reflux disease)    Substance abuse (HCC) 2003   N/A   Past Surgical History:  Procedure Laterality Date   BIOPSY OF SKIN SUBCUTANEOUS TISSUE AND/OR MUCOUS MEMBRANE  11/02/2024   Procedure: BIOPSY, GI;  Surgeon: Aundria, Ladell POUR, MD;  Location: [facility] ENDOSCOPY;  Service: Gastroenterology;;   COLONOSCOPY N/A 11/02/2024   Procedure: COLONOSCOPY;  Surgeon: Toledo, Ladell POUR, MD;  Location: [facility] ENDOSCOPY;  Service: Gastroenterology;  Laterality: N/A;   ESOPHAGOGASTRODUODENOSCOPY N/A 08/15/2019   Procedure: ESOPHAGOGASTRODUODENOSCOPY (EGD);  Surgeon: Toledo, Ladell POUR, MD;  Location: [facility] ENDOSCOPY;  Service: Gastroenterology;  Laterality: N/A;   ESOPHAGOGASTRODUODENOSCOPY N/A 11/02/2024   Procedure: EGD (ESOPHAGOGASTRODUODENOSCOPY);  Surgeon: Toledo, Ladell POUR, MD;  Location: [facility] ENDOSCOPY;  Service: Gastroenterology;  Laterality: N/A;   POLYPECTOMY  11/02/2024   Procedure: POLYPECTOMY, INTESTINE;  Surgeon: Aundria, Ladell POUR, MD;  Location: [facility] ENDOSCOPY;  Service: Gastroenterology;;   Patient Active Problem List   Diagnosis Date Noted   Urgency incontinence 06/01/2024   History of GI bleed 04/14/2024   Nausea and vomiting 12/01/2023   Elevated low density lipoprotein (LDL) cholesterol level 06/29/2023   Vitamin D  deficiency 06/05/2023   Iron deficiency 06/05/2023   Chronic migraine with aura without status migrainosus, not intractable 06/05/2023   Depression, major, single episode, moderate (HCC) 06/05/2023   PTSD (post-traumatic stress disorder) 06/05/2023   IUD (intrauterine device) in place 05/31/2023   History of pancreatitis  05/31/2023   History of alcohol abuse    GERD (gastroesophageal reflux disease)    Vapes nicotine  containing substance     PCP: Cannady, Jolene, NP   REFERRING PROVIDER: Schermerhorn, Beverli GAILS, MD  REFERRING DIAG: N39.41 (ICD-10-CM) - Urge incontinence  THERAPY DIAG:  No diagnosis found.  Rationale for Evaluation and Treatment: Rehabilitation  ONSET DATE: 12 months ago   SUBJECTIVE:                                                                                                                                                                                           SUBJECTIVE STATEMENT: It seems to be getting better.   FUNCTIONAL LIMITATIONS: urge urinary incontinence, urgency and frequency of urine   PERTINENT HISTORY:   Pt is a 39 yo female who presents with urinary urgency and  incontinence. Very rarely has stress urinary incontinence. She tried oxybutynin , gemtesa  and vesicare  but stopped each of these medications due to frustration with it not being successful, she stopped about 4 months ago. She feels that her symptoms have improved over the last several months. Pt feels that her anxiety affects her urinary incontinence and urgency. She notes she has not had full bladder leakage recently (she was having full bladder emptying several times per day). She states she is working to control her anxiety, she will be starting a Vraylar again to help manage symptoms, she has had success in the past. Pt reports 1-2 days prior to menses she has increased symptoms, this improved over the past several months as well.   Fluid intake: 3 12 oz of coffee, 64-96 oz of water per day, occasional juice or soda   Problem List:  Urge incontinence, urgency will increase from 3/10 to 8/10 within 30 seconds of feeling urge.  She will moderate to small sized leakage usually with ambulation to the bathroom. Leakage is occurring 4x per week, small amount, no protection used at this time.  Frequency of  urination will go every 15 minutes in the morning. 3-4 cups of coffee in the morning.  High levels of stress and anxiety which affect her urgency symptoms.   Medications for current condition: Vraylar for anxiety. Has trialed Gemtesa , oxybutynin  and vesicare  without benefit.  Surgeries: NA Sexual abuse: No  PAIN:  Are you having pain? No  PRECAUTIONS: None  RED FLAGS: None   WEIGHT BEARING RESTRICTIONS: No  FALLS:  Has patient fallen in last 6 months? 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 Likes to stay at home. Has been taking a pottery class.   PLOF: Independent  PATIENT GOALS: increased control over bladder.    BOWEL MOVEMENT: Pain with bowel movement: No Constipation: None  Type of bowel movement:Type (Bristol Stool Scale) 4, Frequency 1-2x per day, and Strain None  Fully empty rectum: No Leakage: No                                                 Bowel urgency: None Fiber supplement/laxative No  URINATION: Leakage: Urge to void and Walking to the bathroom Urgency: Yes 3/10 to 8/10 after 30 seconds  Frequency:during the day more urination in the morning in the morning frequency can be every 15 minutes, afternoon can be several hours                                                        Nocturia: No   Fully empty bladder: Yes:                               Stream: Strong and Weak Pads/briefs: No  INTERCOURSE:  Ability to have vaginal penetration No  Pain with intercourse: none Dryness: No Climax: 0 Lubricant:  PREGNANCY: Vaginal deliveries 1 (2022) Tearing Yes: minor Episiotomy No C-section deliveries 0 Currently pregnant No  PROLAPSE: None   OBJECTIVE:  Note: Objective measures were completed at Evaluation unless otherwise noted.  DIAGNOSTIC FINDINGS:   NA  PATIENT SURVEYS:   PFIQ-7: 32/63=50.7% impairment      SENSATION: Light touch: Appears intact   FUNCTIONAL TESTS:  Single leg stance:  Rt: decreased stability with single leg stance, difficulty with core and trunk control, difficulty transferring load through pelvis.   Lt: decreased stability with single leg stance, difficulty with core and trunk control, difficulty transferring load through pelvis.   GAIT: Assistive device utilized: None Comments: WFL  POSTURE:  upper abdominal gripping, forward head, forward shoulders, decreased costal expansion laterally, restricted upper ribcage   Breathing Assessment: breaths into shoulders and upper chest, paradoxical pattern of breathing, decreased expansion into diaphragm, difficulty with ribcage 360 expansion.   ISA/Costal Mobility: decreased ability to abduct and externally rotate ribcage with inhalation.   Abdominal: Tenderness and restriction in lower abdominals and around bladder with referred tenderness into upper L quadrant with palpation of R bladder ligaments  Abdominal Activation: EO substitution with attempted TrA activation in supine. Decreased TrA tone in standing.  Abdominal Gripping: upper abdominal gripping lower abdominal dumping. Diastasis: No Scar tissue: No Active Straight Leg Raise: NT   LUMBARAROM/PROM:  A/PROM A/PROM  Eval (% available)  Flexion WFL  Extension WFL  Right lateral flexion WFL  Left lateral flexion WFL  Right rotation WFL  Left rotation WFL   (Blank rows = not tested)  PALPATION:  General: Tenderness and restriction in lower abdominals and around bladder with referred tenderness into upper L quadrant with palpation of R bladder ligaments  Pelvic Alignment: Posterior pelvic tilt and flattened lumbar lordosis                 External Perineal Exam: NT              PELVIC Internal:   NT at IE       TONE: NA   PROLAPSE: NA   LOWER EXTREMITY MMT:  MMT Right eval Left eval  Hip flexion 4-/5 4-/5  Hip extension 3+/5 3+/5  Hip  abduction 4-/5 4-/5  Hip adduction 4/5 4/5  Hip internal rotation 4/5 4/5  Hip external rotation 4/5 4/5                           (Blank rows = not tested)  TODAY'S TREATMENT:                                                                                                                              DATE: 11/09/2024   EVAL completed Neuromuscular re-education: functional breathing mechanics to promote improved coordination of diaphragm, PFM and TrA synergy, and promote down regulation of sympathetic nervous system Self Care: discussed how anxiety and sympathetic nervous system up regulation increases bladder urgency and leakage, discussed impact of dietary irritants on bladder control.    PATIENT EDUCATION:  Education details: discussed how anxiety  and sympathetic nervous system up regulation increases bladder urgency and leakage, discussed impact of dietary irritants on bladder control.  Person educated: Patient Education method: Explanation, Demonstration, Tactile cues, Verbal cues, and Handouts Education comprehension: verbalized understanding  HOME EXERCISE PROGRAM: Functional breathing:  Inhale:  Through the nose Diaphragm first (touch your diaphragm, can use little doggy sniffs to find it) Chest and ribs expand second  Shoulder stay still  Exhale:  Through the nose  Slow down and let go Monitor fluid intake and have more accurate patterns and volumes next visit.   ASSESSMENT:  CLINICAL IMPRESSION: Patient is a 39 y.o. female who presents with urge incontinence and frequent urgency which has been present for several years but has improved over the last several months. She trialed several urge suppression medications without benefit. She repots high levels of anxiety which impact symptoms. She also notes mensis affects urgency and incontinence. Pt has forward shoulder and decreased ability to expand through costals and access diaphragm impacting intra-abdominal pressure  management. She has decreased pelvic girdle strength and decreased gluteal and lower abdominal tone overall impacting PFM function. Pt will benefit from propioception/ coordination/ body mechanics training and education with gravity-loaded tasks at work and home and  fitness modifications in order to gain a more effective intraabdominal pressure system to minimize Sx. Regional interdependent approaches including thoracic mobility, costal expansion and coordination of functional slings throughout the body utilized to yield greater benefits in pt's POC. Pt would benefit from a biopsychosocial approach to yield optimal outcomes. Pt will benefit from skilled PT in order to address urinary urgency and incontinence, pressure management deficits and weakness and incoordination in pelvic girdle, abdominal musculature and postural muscles to promote full activity and participation.  Problem List:  Urge incontinence, urgency will increase from 3/10 to 8/10 within 30 seconds of feeling urge.  She will moderate to small sized leakage usually with ambulation to the bathroom. Leakage is occurring 4x per week, small amount, no protection used at this time.  Frequency of urination will go every 15 minutes in the morning. 3-4 cups of coffee in the morning.  High levels of stress and anxiety which affect her urgency symptoms.   OBJECTIVE IMPAIRMENTS: decreased activity tolerance, decreased balance, decreased coordination, decreased endurance, improper body mechanics, and postural dysfunction.   ACTIVITY LIMITATIONS: standing, continence, and toileting  PARTICIPATION LIMITATIONS: cleaning, laundry, interpersonal relationship, driving, shopping, and community activity  PERSONAL FACTORS: Behavior pattern, Past/current experiences, Social background, Time since onset of injury/illness/exacerbation, and 1-2 comorbidities: anxiety, dietary irritant intake are also affecting patient's functional outcome.   REHAB POTENTIAL:  Good  CLINICAL DECISION MAKING: Evolving/moderate complexity  EVALUATION COMPLEXITY: Moderate   GOALS: Goals reviewed with patient? Yes  SHORT TERM GOALS: Target date: 12/07/2024    Pt will be consistent and independent with HEP to promote ability to perform daily activities with reduced symptoms. Baseline: Not IND  Goal status: INITIAL   LONG TERM GOALS: Target date: 02/01/2025  Problem List:  Urge incontinence, urgency will increase from 3/10 to 8/10 within 30 seconds of feeling urge.  She will moderate to small sized leakage usually with ambulation to the bathroom. Leakage is occurring 4x per week, small amount, no protection used at this time.  Frequency of urination will go every 15 minutes in the morning. 3-4 cups of coffee in the morning.  High levels of stress and anxiety which affect her urgency symptoms.   1.Pt will demo proper deep core coordination without chest breathing and optimal excursion  of diaphragm/pelvic floor in order to promote spinal stability and pelvic floor function  Baseline: dyscoordination Goal status: INITIAL  2.  Pt will be consistent and independent with techniques to reduce sympathetic over activation and promote parasympathetic up regulation to reduce bladder urgency and incontinence  Baseline: High levels of stress and anxiety which affect her urgency symptoms. Goal status: INITIAL  3. Pt will be able to delay urination by 1-2 minutes and reduce urgency from 8/10 to <5/10 with urge suppression techniques to reduce urinary incontinence and impact of urgency on quality of life.  Baseline: Urge incontinence, urgency will increase from 3/10 to 8/10 within 30 seconds of feeling urge and is unable to suppress urination   Goal status: INITIAL  4. Pt will have <1 episode of urge incontinence per week to improve ability to perform daily tasks and community participation.  Baseline: She will moderate to small sized leakage usually with ambulation to the  bathroom. Leakage is occurring 4x per week, small amount, no protection used at this time.  Goal status: INITIAL  5. Pt will have a urination interval of greater than 1 hour in the morning to improve ability to perform daily tasks without disruption.  Baseline: Frequency of urination will go every 15 minutes in the morning. 3-4 cups of coffee in the morning.  Goal status: INITIAL   PLAN:  PT FREQUENCY: 1x/week  PT DURATION: 12 weeks  PLANNED INTERVENTIONS: 97110-Therapeutic exercises, 97530- Therapeutic activity, 97112- Neuromuscular re-education, 97535- Self Care, [id number]- Manual therapy, [id number]- Electrical stimulation (unattended), [phone]- Electrical stimulation (manual), [phone] (1-2 muscles), [id number] (3+ muscles)- Dry Needling, Patient/Family education, Balance training, Taping, Joint mobilization, Scar mobilization, Cryotherapy, Moist heat, and Biofeedback   PLAN FOR NEXT SESSION: Review fluid intake, review functional breathing mechanics (more details and positions), acupressure for relaxation, urge suppression techniques    Donna Porter, PT 11/05/2024, 10:42 AM  "

## 2024-11-09 ENCOUNTER — Other Ambulatory Visit: Payer: Self-pay

## 2024-11-09 ENCOUNTER — Ambulatory Visit

## 2024-11-09 DIAGNOSIS — N3946 Mixed incontinence: Secondary | ICD-10-CM

## 2024-11-09 DIAGNOSIS — R278 Other lack of coordination: Secondary | ICD-10-CM

## 2024-11-09 DIAGNOSIS — N3941 Urge incontinence: Secondary | ICD-10-CM

## 2024-11-09 DIAGNOSIS — M6281 Muscle weakness (generalized): Secondary | ICD-10-CM

## 2024-11-16 ENCOUNTER — Ambulatory Visit

## 2024-11-23 ENCOUNTER — Ambulatory Visit

## 2024-11-30 ENCOUNTER — Ambulatory Visit

## 2024-12-07 ENCOUNTER — Ambulatory Visit

## 2024-12-14 ENCOUNTER — Ambulatory Visit

## 2024-12-21 ENCOUNTER — Ambulatory Visit

## 2024-12-28 ENCOUNTER — Ambulatory Visit

## 2025-01-31 ENCOUNTER — Ambulatory Visit: Admitting: Nurse Practitioner
